# Patient Record
Sex: Female | Born: 1940
Health system: Southern US, Community
[De-identification: ages and names within clinical notes are randomized; demographics above are authoritative.]

## PROBLEM LIST (undated history)

## (undated) DIAGNOSIS — M810 Age-related osteoporosis without current pathological fracture: Secondary | ICD-10-CM

## (undated) DIAGNOSIS — I639 Cerebral infarction, unspecified: Secondary | ICD-10-CM

## (undated) DIAGNOSIS — R4701 Aphasia: Secondary | ICD-10-CM

## (undated) DIAGNOSIS — G839 Paralytic syndrome, unspecified: Secondary | ICD-10-CM

## (undated) DIAGNOSIS — G809 Cerebral palsy, unspecified: Secondary | ICD-10-CM

## (undated) DIAGNOSIS — I519 Heart disease, unspecified: Secondary | ICD-10-CM

## (undated) DIAGNOSIS — I729 Aneurysm of unspecified site: Secondary | ICD-10-CM

## (undated) HISTORY — PX: CEREBRAL ANEURYSM REPAIR: SHX164

## (undated) HISTORY — PX: OTHER SURGICAL HISTORY: SHX169

## (undated) HISTORY — DX: Cerebral palsy, unspecified: G80.9

## (undated) HISTORY — DX: Paralytic syndrome, unspecified: G83.9

## (undated) HISTORY — DX: Heart disease, unspecified: I51.9

## (undated) HISTORY — DX: Aneurysm of unspecified site: I72.9

## (undated) HISTORY — DX: Cerebral infarction, unspecified: I63.9

## (undated) HISTORY — DX: Aphasia: R47.01

---

## 1997-10-30 ENCOUNTER — Other Ambulatory Visit: Admission: RE | Admit: 1997-10-30 | Discharge: 1997-10-30 | Payer: Self-pay | Admitting: Obstetrics and Gynecology

## 1999-04-01 ENCOUNTER — Other Ambulatory Visit: Admission: RE | Admit: 1999-04-01 | Discharge: 1999-04-01 | Payer: Self-pay | Admitting: Obstetrics and Gynecology

## 2000-04-08 ENCOUNTER — Other Ambulatory Visit: Admission: RE | Admit: 2000-04-08 | Discharge: 2000-04-08 | Payer: Self-pay | Admitting: Obstetrics and Gynecology

## 2001-04-06 ENCOUNTER — Encounter: Admission: RE | Admit: 2001-04-06 | Discharge: 2001-04-06 | Payer: Self-pay | Admitting: Obstetrics and Gynecology

## 2001-04-06 ENCOUNTER — Encounter: Payer: Self-pay | Admitting: Obstetrics and Gynecology

## 2001-04-22 ENCOUNTER — Other Ambulatory Visit: Admission: RE | Admit: 2001-04-22 | Discharge: 2001-04-22 | Payer: Self-pay | Admitting: Obstetrics and Gynecology

## 2002-11-14 ENCOUNTER — Encounter: Payer: Self-pay | Admitting: Internal Medicine

## 2002-11-14 ENCOUNTER — Encounter: Admission: RE | Admit: 2002-11-14 | Discharge: 2002-11-14 | Payer: Self-pay | Admitting: Internal Medicine

## 2004-10-04 ENCOUNTER — Encounter: Admission: RE | Admit: 2004-10-04 | Discharge: 2004-10-04 | Payer: Self-pay | Admitting: Internal Medicine

## 2005-01-03 ENCOUNTER — Emergency Department (HOSPITAL_COMMUNITY): Admission: EM | Admit: 2005-01-03 | Discharge: 2005-01-03 | Payer: Self-pay | Admitting: Emergency Medicine

## 2006-11-24 ENCOUNTER — Encounter: Admission: RE | Admit: 2006-11-24 | Discharge: 2006-11-24 | Payer: Self-pay | Admitting: Internal Medicine

## 2006-12-01 ENCOUNTER — Encounter: Admission: RE | Admit: 2006-12-01 | Discharge: 2006-12-01 | Payer: Self-pay | Admitting: Internal Medicine

## 2010-08-11 ENCOUNTER — Encounter: Payer: Self-pay | Admitting: Internal Medicine

## 2014-03-21 ENCOUNTER — Ambulatory Visit (INDEPENDENT_AMBULATORY_CARE_PROVIDER_SITE_OTHER): Payer: Medicare Other

## 2014-03-21 VITALS — BP 139/76 | HR 88 | Resp 15 | Ht 62.0 in | Wt 121.2 lb

## 2014-03-21 DIAGNOSIS — G629 Polyneuropathy, unspecified: Secondary | ICD-10-CM

## 2014-03-21 DIAGNOSIS — M79609 Pain in unspecified limb: Secondary | ICD-10-CM

## 2014-03-21 DIAGNOSIS — B351 Tinea unguium: Secondary | ICD-10-CM

## 2014-03-21 DIAGNOSIS — G609 Hereditary and idiopathic neuropathy, unspecified: Secondary | ICD-10-CM

## 2014-03-21 DIAGNOSIS — M19079 Primary osteoarthritis, unspecified ankle and foot: Secondary | ICD-10-CM

## 2014-03-21 DIAGNOSIS — Q828 Other specified congenital malformations of skin: Secondary | ICD-10-CM

## 2014-03-21 DIAGNOSIS — M79676 Pain in unspecified toe(s): Secondary | ICD-10-CM

## 2014-03-21 DIAGNOSIS — G839 Paralytic syndrome, unspecified: Secondary | ICD-10-CM

## 2014-03-21 NOTE — Patient Instructions (Signed)
Corns and Calluses Corns are small areas of thickened skin that usually occur on the top, sides, or tip of a toe. They contain a cone-shaped core with a point that can press on a nerve below. This causes pain. Calluses are areas of thickened skin that usually develop on hands, fingers, palms, soles of the feet, and heels. These are areas that experience frequent friction or pressure. CAUSES  Corns are usually the result of rubbing (friction) or pressure from shoes that are too tight or do not fit properly. Calluses are caused by repeated friction and pressure on the affected areas. SYMPTOMS  A hard growth on the skin.  Pain or tenderness under the skin.  Sometimes, redness and swelling.  Increased discomfort while wearing tight-fitting shoes. DIAGNOSIS  Your caregiver can usually tell what the problem is by doing a physical exam. TREATMENT  Removing the cause of the friction or pressure is usually the only treatment needed. However, sometimes medicines can be used to help soften the hardened, thickened areas. These medicines include salicylic acid plasters and 12% ammonium lactate lotion. These medicines should only be used under the direction of your caregiver. HOME CARE INSTRUCTIONS   Try to remove pressure from the affected area.  You may wear donut-shaped corn pads to protect your skin.  You may use a pumice stone or nonmetallic nail file to gently reduce the thickness of a corn.  Wear properly fitted footwear.  If you have calluses on the hands, wear gloves during activities that cause friction.  If you have diabetes, you should regularly examine your feet. Tell your caregiver if you notice any problems with your feet. SEEK IMMEDIATE MEDICAL CARE IF:   You have increased pain, swelling, redness, or warmth in the affected area.  Your corn or callus starts to drain fluid or bleeds.  You are not getting better, even with treatment. Document Released: 04/12/2004 Document  Revised: 09/29/2011 Document Reviewed: 03/04/2011 Exit[employer] Patient Information 2015 [employer]. This information is not intended to replace advice given to you by your health care provider. Make sure you discuss any questions you have with your health care provider.  

## 2014-03-21 NOTE — Progress Notes (Signed)
   Subjective:    Patient ID: Amanda Best, female    DOB: 11-Mar-1941, 73 y.o.   MRN: [id number]  HPI Comments: N callous L right lateral midfoot D and O long-term C hard painful corn A drop-foot brace T pt's caregiver debrides occasionally     Review of Systems  All other systems reviewed and are negative.      Objective:   Physical Exam 73 year old female what well-developed somewhat frail oriented although may have some cognitive abnormality does have some history of paralysis or history of CVA. Lower extremity objective as follows pedal pulses palpable with thready DP one over 4 PT one over 4 thready nonpalpable mild varicosities noted no edema rubor noted neurologic epicritic and proprioceptive sensations intact although there muscle weakness. 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 Plan at this time the nails debrided and presence of pain symptomatology ulcer really  keratotic lesion recommended cushioned shoes patient does wear an AFO brace and will continue to do so. Reappointed in the future on an as-needed basis for followup if you have questions about a fungus nail suggested topical antifungal however patient is not able to self applied to is not likely a feasible option at this time. In the future symptoms were to persist consideration for permanent nail excision which was done on the right foot would be beneficial. Followup when needed  Alvan Dame DPM

## 2014-08-10 ENCOUNTER — Encounter: Payer: Self-pay | Admitting: Podiatrist

## 2014-08-10 ENCOUNTER — Ambulatory Visit (INDEPENDENT_AMBULATORY_CARE_PROVIDER_SITE_OTHER): Payer: Medicare Other | Admitting: Podiatrist

## 2014-08-10 DIAGNOSIS — B351 Tinea unguium: Secondary | ICD-10-CM

## 2014-08-10 DIAGNOSIS — Q828 Other specified congenital malformations of skin: Secondary | ICD-10-CM

## 2014-08-10 DIAGNOSIS — I998 Other disorder of circulatory system: Secondary | ICD-10-CM

## 2014-08-10 DIAGNOSIS — M79676 Pain in unspecified toe(s): Secondary | ICD-10-CM

## 2014-08-10 NOTE — Progress Notes (Deleted)
   Subjective:    Patient ID: Amanda Best, female    DOB: [birth year], 74 y.o.   MRN: [id number]  HPI Comments: N callous L right lateral midfoot D and O long-term C hard painful corn A drop-foot brace T pt's caregiver debrides occasionally     Review of Systems  All other systems reviewed and are negative.      Objective:   Physical Exam 74 year old female what well-developed somewhat frail oriented although may have some cognitive abnormality does have some history of paralysis or history of CVA. Lower extremity objective as follows pedal pulses palpable with thready DP one over 4 PT one over 4 thready nonpalpable mild varicosities noted no edema rubor noted neurologic epicritic and proprioceptive sensations intact although there muscle weakness. 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 Plan at this time the nails debrided and presence of pain symptomatology ulcer really  keratotic lesion recommended cushioned shoes patient does wear an AFO brace and will continue to do so. Reappointed in the future on an as-needed basis for followup if you have questions about a fungus nail suggested topical antifungal however patient is not able to self applied to is not likely a feasible option at this time. In the future symptoms were to persist consideration for permanent nail excision which was done on the right foot would be beneficial. Followup when needed  Alvan Dameichard Sikora DPM

## 2014-08-10 NOTE — Progress Notes (Signed)
Chief Complaint  Patient presents with  . Debridement    Trim toenails  . Callouses    Trim callus lateral right      HPI: Patient is 74 y.o. female who presents today for routine foot and nail care. She is nonambulatory and presents in a wheelchair. She also wears a dropfoot brace for her right foot as well. No Known Allergies  Physical Exam  Patient is awake, alert, In no acute distress.  Vascular status is decreased with palpable pedal pulses at 1/4 DP and PT bilateral and capillary refill time within normal limits. Neurological sensation is also decreased bilaterally via Semmes Weinstein monofilament at 2/5 sites. Light touch, vibratory sensation also decreased.   Dermatological exam reveals a large hyperkeratotic lesion lateral fifth metatarsal base of the right foot. Intact integument is noted post debridement. Lesion is porokeratotic in nature. Bilateral hallux nails are thickened, discolored, dystrophic in symptomatic as well as clinically mycotic bilateral.  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   Assessment: symptomatic bilateral hallux nails, callus sub met 5 base right foot, dropfoot, decreased pulses  Plan: symptomatic toenails and calluses debrided today without complication.  Recommend routine debridements every 3-4 months.  Placed a piece of moleskin on the brace at the area of the fifth met lesion-- will possibly pocket accomidate brace at next visit if no improvement in callus noted.

## 2014-11-10 ENCOUNTER — Ambulatory Visit: Payer: Medicare Other | Admitting: Podiatrist

## 2015-07-20 NOTE — Nursing Note (Signed)
Nursing Discharge Summary - Text       Nursing Discharge Summary Entered On:  07/20/2015 19:34 EST    Performed On:  07/20/2015 19:34 EST by Homero FellersFRANK, RN, AMANDA L               DC Information   Discharge To, Anticipated :   Home with family support   Professional Skilled Services, Anticipated :   No Needs   Mode of Discharge :   Wheelchair   Transportation :   Private vehicle   Accompanied By :   Daun PeacockSon   FRANK, RN, AMANDA L - 07/20/2015 19:34 EST   Education   Responsible Learner(s) :   No Data Available     Barriers To Learning :   None evident   Teaching Method :   Explanation, Printed materials   [place], RN, [place] - 07/20/2015 19:34 EST   Post-Hospital Education Adult Grid   Disease Process :   Verbalizes understanding   Importance of Follow-Up Visits :   Verbalizes understanding   When to Call Health Care Provider :   [facility]   [place], RN, [facility] - 07/20/2015 19:34 EST   Additional Learner(s) Present :   Son   Homero FellersFRANK, RN, AMANDA L - 07/20/2015 19:34 EST

## 2016-01-22 NOTE — Procedures (Signed)
Procedure Record - [facility]             Procedure Record - SFEND Summary                                                                Primary Physician:        Ricke Hey    Case Number:              [id number]    Finalized Date/Time:      02/28/16 11:29:01    Pt. Name:                 Linda Murray, Linda Murray    D.O.B./Sex:               1941/07/19    Female    Med Rec #:                [id number]    Physician:                Ricke Hey    Financial #:              [id number]    Pt. Type:                 O    Room/Bed:                 /    Admit/Disch:              01/22/16 18:22:00 -                              01/22/16 22:22:00    Institution:       [facility] - Case Times                                                                                                        Entry 1  Patient      In Room Time             01/22/16 20:45:00               Out Room Time                   01/22/16 21:28:00    Anesthesia     Procedure      Start Time               01/22/16 21:00:00               Stop Time                       01/22/16 21:06:00    Last Modified By:         Corrie Mckusick RN, DONNA J                              01/22/16 21:27:24      SFEND - Case Times Audit                                                                         01/22/16 21:27:24         Owner: Donna Christen                               Modifier: BENODO                                                        <+> 1         Out Room Time     01/22/16 21:06:45         Owner: Donna Christen                               Modifier: BENODO                                                        <+> 1         Stop Time        SFEND - Case Attendance                                                                                                   Entry  1                         Entry 2                          Entry 3                                          Case Attendee             SHORES-MD,  Kearney Hard, RN, [place],  [place]    Role Performed            Surgeon Primary                 Circulator                      Endoscopy Technician    Time In                   01/22/16 20:45:00               01/22/16 20:45:00               01/22/16 20:45:00    Time Out                  01/22/16 21:28:00               01/22/16 21:28:00               01/22/16 21:28:00    Procedure                 Esophagastroduodenoscopy        Esophagastroduodenoscopy        Esophagastroduodenoscopy                               with Removal Fo                 with Removal Fo                 with Removal Fo    Last Modified By:         Corrie Mckusick RN, DONNA Cleatrice Burke, RN, DONNA Cleatrice Burke, RN, DONNA J                              01/22/16 21:27:26               01/22/16 21:27:26               01/22/16 21:27:26                                Entry 4  Case Attendee             Oswaldo Conroy    Role Performed            Anesthesiologist    Time In                   01/22/16 20:45:00    Time Out                  01/22/16 21:28:00    Procedure                 Esophagastroduodenoscopy                               with Removal Fo    Last Modified By:         Corrie Mckusick RN, DONNA J                              01/22/16 21:27:26      SFEND - Case Attendance Audit                                                                    01/22/16 21:27:26         Owner: Donna Christen                               Modifier: BENODO                                                            1     <+> Time Out            1     <*> Procedure                              Esophagastroduodenoscopy with Removal Fo            2     <+>  Time Out            2     <*> Procedure                              Esophagastroduodenoscopy with Removal Fo            3     <+> Time Out            3     <*> Procedure                              Esophagastroduodenoscopy with Removal Fo  4     <+> Time Out            4     <*> Procedure                              Esophagastroduodenoscopy with Removal Fo     01/22/16 21:07:28         Owner: Donna Christen                               Modifier: BENODO                                                        <+> 1         Procedure        <+> 2         Procedure        <+> 3         Procedure        <+> 4         Procedure     01/22/16 21:07:20         Owner: Donna Christen                               Modifier: BENODO                                                            1     <-> Procedure                              Esophagastroduodenoscopy            2     <-> Procedure                              Esophagastroduodenoscopy            3     <-> Procedure                              Esophagastroduodenoscopy            4     <-> Procedure                              Esophagastroduodenoscopy     01/22/16 21:06:49         Owner: Donna Christen                               Modifier: Donna Christen  1     <*> Procedure                              Esophagastroduodenoscopy            2     <+> Time In            2     <*> Procedure                              Esophagastroduodenoscopy            3     <+> Time In            3     <*> Procedure                              Esophagastroduodenoscopy            4     <+> Time In            4     <*> Procedure                              Esophagastroduodenoscopy     01/22/16 21:06:40         Owner: Donna Christen                               Modifier: BENODO                                                        <+> 2         Case Attendee        <+> 2         Role Performed        <+> 2         Procedure        <+> 3         Case  Attendee        <+> 3         Role Performed        <+> 3         Procedure        <+> 4         Case Attendee        <+> 4         Role Performed        <+> 4         Procedure     01/22/16 21:05:14         Owner: [email or web address]                               Modifier: BENODO  1     <+> Time In            1     <*> Procedure                              Esophagastroduodenoscopy        SFEND - Patient Positioning                                                                     Pre-Care Text:            A.240 Assesses baseline skin condition A.280 Identifies baseline musculoskeletal status A.280.1 Identifies           physical alterations that require additional precautions for procedure-specific positioning A.510.8 Maintains           patient's dignity and privacy Im.120 Implements protective measures to prevent skin/tissue injury due to           mechanical sources Im.40 Positions the patient Im.80 Applies safety devices                              Entry 1                                                                                                          Procedure                 Esophagastroduodenoscopy        Body Position                   Lateral Left Up                               with Removal Fo    Left Arm Position         Extended in Arm Cradle          Right Arm Position              Extended in Arm Cradle    Left Leg Position         Padding Under Knee              Right Leg Position              Padding Under Knee    Feet Uncrossed            Yes                             Pressure Points  Yes                                                              Checked     Positioning Device        Foam Wedge                      Positioned By                   Corrie Mckusick, RN, DONNA J,                                                                                              BURNETTE-MD,  JEFFREY                                                                                               DEAN, Sinkler,  [place]    Outcome Met (O.80)        Yes    Last Modified By:         Corrie Mckusick, RN, DONNA J                              01/22/16 21:09:28    Post-Care Text:            E.10 Evaluates for signs and symptoms of physical injury to skin and tissue E.290 Evaluates musculoskeletal           status O.80 Patient is free from signs and symptoms of injury related to positioning O.120 the patient is free           from signs and symptoms of injury related to transfer/transport  O.250 Patient's musculoskeletal status is           maintained at or improved from baseline levels      SFEND - Skin Assessment                                                                         Pre-Care Text:            A.240 Assesses baseline skin condition Im.120 Implements protective measures to prevent skin or tissue injury  due to mechanical sources  Im.280.1 Implements progective measures to prevent skin or tissue injury due to           thermal sources Im.360 Monitors for signs and symptons of infection                              Entry 1                                                                                                          Skin Integrity            Intact    Last Modified By:         Corrie Mckusick, RN, DONNA J                              01/22/16 21:09:33    Post-Care Text:            E.10 Evaluates for signs and symptoms of physical injury to skin and tissue E.270 Evaluate tissue perfusion           O.60 Patient is free from signs and symptoms of injury caused by extraneous objects   O.210 Patinet's tissue           perfusion is consistent with or improved from baseline levels      SFEND - Procedure Time Out                                                                      Pre-Care Text:            A.10 Confirms patient identity A.20 Verifies operative procedure, surgical site, and laterality A.20.1 Verifies           consent for planned procedure  A.30 Verifies allergies                              Entry 1                                                                                                          Surgical/Procedure        Yes  Time Out Complete               01/22/16 20:59:00    Team confirms     correct patient,     correct site and     correct procedure     Last Modified By:         Corrie Mckusick RN, DONNA J                              01/22/16 21:05:26    Post-Care Text:            E.30 Evaluates verification process for correct patient, site, side, and level surgery      SFEND - General Case Data                                                                                                 Entry 1                                                                                                          Case Information      ASA Class                3E                              Case Level                      Level 2     OR                       SF Endo 04                      Specialty                       Gastroenterology (SN)     Wound Class              No Incision    Preop Diagnosis           Food impaction    Last Modified By:         Samella Parr B                              02/28/16 11:28:57      [place] - General Case Data Audit  02/28/16 11:28:57         Owner: [email or web address]                               Modifier: BISHOFFT                                                          1     <*> OR                                     SF Endo Add On     01/23/16 11:19:08         Owner: Donna Christen                               Modifier: [email or web address]                                                            1     <*> Case Level                             Level 1     01/22/16 21:07:03         Owner: Donna Christen                               Modifier: BENODO                                                        <+> 1         ASA Class        <+> 1         Preop  Diagnosis        SFEND - Procedures                                                                                                        Entry 1  Procedure     Description      Procedure                Esophagastroduodenoscopy        Surgical Procedure              Food Impaction                               with Removal Foreign           Text                               Body    Primary Procedure         Yes                             Primary Surgeon                 Ander Slade                                                                                              [facility]    Start                     01/22/16 21:00:00               Stop                            01/22/16 21:06:00    Anesthesia Type           Monitored Anesthesia            Surgical Service                Gastroenterology (SN)                              Care    Wound Class               No Incision    Last Modified By:         Corrie Mckusick, RN, DONNA J                              01/22/16 21:07:24      SFEND - Foreign Body Retrieval  Entry 1                                                                                                          Endoscopic                Esophagus                       Foreign Object for              food    Anatomical Location                                       Retrieval     Last Modified By:         Corrie Mckusick, RN, DONNA J                              01/22/16 21:07:54      SFEND - Transfer                                                                                                          Entry 1                                                                                                          Transferred By            Corrie Mckusick, RN, DONNA J,            Via                             Stretcher                               BURNETTE-MD,  JEFFREY                              Marlou Sa  Post-op Destination       PACU    Skin Assessment      Condition                Intact    Last Modified By:         Corrie Mckusick RN, DONNA J                              01/22/16 21:09:47      Case Comments                                                                                         <None>              Finalized By: Jaclynn Major      Document Signatures                                                                             Signed By:           Corrie Mckusick RN, DONNA J 01/22/16 21:27          CXKGY,  JENNIFER 01/23/16 11:19          CURRY,  TRACI B 02/28/16 11:29      Unfinalized History                                                                                     Date/Time            Username    Reason for Unfinalizing         Freetext Reason for Unfinalizing                                          01/23/16 11:19       Lorraine      Charging          02/28/16 11:28       BISHOFFT    Quality Audits

## 2016-01-22 NOTE — Nursing Note (Signed)
Nursing Discharge Summary - Text       Nursing Discharge Summary Entered On:  01/22/2016 22:21 EDT    Performed On:  01/22/2016 22:21 EDT by CABE, RN, ADAM Z               DC Information   Discharge To, Anticipated :   Home with family support   Mode of Discharge :   Wheelchair   Transportation :   Private vehicle   Accompanied By :   Liliana ClineSon   CABE, RN, ADAM Z - 01/22/2016 22:21 EDT   Education   Responsible Learner(s) :   No Data Available     Home Caregiver Present for Session :   Yes   Barriers To Learning :   None evident   Teaching Method :   Demonstration, Explanation, Printed materials   CABE, RN, ADAM Z - 01/22/2016 22:21 EDT   Post-Hospital Education Adult Grid   Activity Expectations :   Verbalizes understanding   Diagnostic Results :   Verbalizes understanding   Disease Process :   Verbalizes understanding   Equipment/Devices :   TEFL teacherVerbalizes understanding   Importance of Follow-Up Visits :   Verbalizes understanding   Pain Management :   Verbalizes understanding   Plan of Care :   Verbalizes understanding   When to Call Health Care Provider :   Verbalizes understanding   CABE, RN, ADAM Z - 01/22/2016 22:21 EDT   Additional Learner(s) Present :   Liliana ClineSon   CABE, RN, ADAM Z - 01/22/2016 22:21 EDT

## 2016-01-22 NOTE — Nursing Note (Signed)
Medication Administration Follow Up-Text       Medication Administration Follow Up Entered On:  01/22/2016 19:42 EDT    Performed On:  01/22/2016 19:42 EDT by Loura PardonABE, RN, ADAM Z      Intervention Information:     glucagon  Performed by Loura PardonABE, RN, ADAM Z on 01/22/2016 19:12:00 EDT       glucagon,1mg   IM,Deltoid, Left       Medication Effectiveness Evaluation   Medication Administration Reason :   Other: Pt. choking   Medication Effective :   Yes   Medication Response :   Symptoms improved, Continue to observe for symptoms, Provider notified   CABE, RN, ADAM Z - 01/22/2016 19:42 EDT

## 2016-01-22 NOTE — H&P (Signed)
EGDForeign bodyH&P        Patient:   Linda Murray, Linda Murray             MRN: [id number]            FIN: [id number]               Age:   75 years     Sex:  Female     DOB:  1941-04-26   Associated Diagnoses:   None   Author:   Ricke Hey      Preoperative Information   Dysphagia, Food impaction      Chief Complaint   Dysphagia   Presents from home. Has not been able handle liquids or saliva since eating pork chops sandwich. No difficulty breathing. Has been having dysphagia intermittently over the last 6 months.      Review of Systems   Constitutional:  No fever, No chills, No sweats.    Eye:  deferred.    Ear/Nose/Mouth/Throat:  deferred.    Respiratory:  No shortness of breath, No cough.    Cardiovascular:  No chest pain, No palpitations.    Gastrointestinal:  Dysphagia.    Genitourinary:  deferred.    Hematology/Lymphatics:  deferred.    Endocrine:  deferred.    Immunologic:  deferred.    Musculoskeletal:  deferred.    Integumentary:  deferred.    Neurologic:  deferred.    Psychiatric:  deferred.    deferred      Health Status   Allergies:    Allergic Reactions (Selected)  No Known Medication Allergies,    Allergies (1) Active Reaction  No Known Medication Allergies None Documented     Current medications: None,  (Selected)   Inpatient Medications  Future  Zero Time Placeholder: 1 mL, Day of Tx, Day 1  Zero Time Placeholder: 1 mL, Day of Tx, Day 1  denosumab: 60 mg, Subcutaneous, Day of Tx, Day 1  denosumab: 60 mg, Subcutaneous, Day of Tx, Day 1  Documented Medications  Documented  Caltrate: mg, Oral, Daily, 0 Refill(s)  Dilantin 100 mg oral capsule, extended release: 300 mg, 3 cap, Oral, Once a day (at bedtime), 270 cap, 0 Refill(s)  Dilantin 100 mg oral capsule, extended release: TK ONE C PO  QAM AND TK 2 CS PO QHS  Prolia 60 mg/mL subcutaneous solution: 60 mg, 1 mL, Subcutaneous, q45mo 1 mL, 0 Refill(s)  VESIcare 10 mg oral tablet: TK 1 T PO D,    Home Medications (5) Active  Caltrate , Oral, Daily  Dilantin  100 mg oral capsule, extended release   Dilantin 100 mg oral capsule, extended release 300 mg = 3 cap, Oral, Once a day (at bedtime)  Prolia 60 mg/mL subcutaneous solution 60 mg = 1 mL, Subcutaneous, q642moVESIcare 10 mg oral tablet   ,    No qualifying data available     Problem list:    No problem items selected or recorded.,    Active Problems (3)  Aphasia   Apraxia   CVA (cerebral vascular accident)         Histories   Past Medical History:    No active or resolved past medical history items have been selected or recorded.   Family History:    No family history items have been selected or recorded.   Procedure history:    No active procedure history items have been selected or recorded.   Social History  Social & Psychosocial Habits    Tobacco  01/22/2016  Use: Former smoker    Type: Cigarettes  .        Physical Examination   Vital Signs   01/22/2016 20:06 EDT Heart Rate Monitored 87 bpm    Respiratory Rate 16 br/min    Systolic Blood Pressure 350 mmHg  HI    Diastolic Blood Pressure 73 mmHg    SpO2 98 %   01/22/2016 18:24 EDT Temperature Oral 36.4 degC    Heart Rate Monitored 83 bpm    Respiratory Rate 17 br/min    Systolic Blood Pressure 95 mmHg    Diastolic Blood Pressure 89 mmHg    SpO2 96 %         Vital Signs (last 24 hrs)_____  Last Charted___________  Temp Oral     36.4 degC  (JUL 04 18:24)  Resp Rate         16 br/min  (JUL 04 20:06)  SBP      H 176mHg  (JUL 04 20:06)  DBP      73 mmHg  (JUL 04 20:06)  SpO2      98 %  (JUL 04 20:06)  Height      165.1 cm  (JUL 04 18:24)     Measurements from flowsheet : Measurements   01/22/2016 18:24 EDT Height/Length Measured 165.1 cm    Weight Dosing 51 kg      General:  Alert and oriented, No acute distress.    Respiratory:  Lungs are clear to auscultation.    Cardiovascular:  Normal rate, Regular rhythm.    Gastrointestinal:  Soft, Non-tender, Non-distended, Normal bowel sounds.       Health Maintenance      Health Maintenance     Pending (in the next year)      There are no current recommendations pending     Satisfied (in the past 1 year)     There are no satisfied recommendations within the defined date range          Review / Management   Results review:     Labs (Last four charted values)  WBC                  8.4 (JUL 04)   Hgb                  14.0 (JUL 04)   Hct                  39.5 (JUL 04)   Plt                  220 (JUL 04)   Na                   144 (JUL 04)   K                    4.2 (JUL 04)   CO2                  26 (JUL 04)   Cl                   104 (JUL 04)   Cr                   0.8 (JUL 04)   BUN  18 (JUL 04)   Glucose Random       H 106 (JUL 04) , Lab results   01/22/2016 19:43 EDT Estimated Creatinine Clearance 48.92 mL/min   01/22/2016 19:12 EDT WBC 8.4 x10e3/mcL    RBC 3.98 x10e6/mcL    Hgb 14.0 g/dL    Hct 39.5 %    MCV 99.1 fL  HI    MCH 35.3 pg  HI    MCHC 35.6 g/dL    RDW 13.4 %    Platelet 220 x10e3/mcL    MPV 7.8 fL    Sodium Lvl 144 mmol/L    Potassium Lvl 4.2 mmol/L    Chloride 104 mmol/L    CO2 26 mmol/L    Glucose Random 106 mg/dL  HI    BUN 18 mg/dL    Creatinine Lvl 0.8 mg/dL    BUN/Creat Ratio 21.7  HI    AGAP 14 mmol/L    Osmolality Calc 289 mOsm/kg    Calcium Lvl 9.2 mg/dL    eGFR AA >60  NA    eGFR Non-AA >60  NA   .    egd +/- dilation      Impression and Plan   Diagnosis     dysphagia.     Foreign body in esophagus.     Condition:  Stable.    Diagnosis   Course:  Progressing as expected.    Counseled:  Patient.       Professional Services   EGD+ foreign body removal   Librarian, academic Signed on 01/22/2016 08:28 PM EDT   ________________________________________________   Ricke Hey

## 2016-01-24 NOTE — Oncology Nurse Navigation (Signed)
Oncology Patient Summary                    [facility]  [address]  [place], [place] [id number]  [phone]        Person Information:  Name: Dwain SarnaSTURM, Ikesha  Birthdate: 1941/01/17  Home Address: [address] [place] [id number]        Any confirmed future appointments are below:               Type [facility]   INF Injection 60 Minutes SF Inf Ther 01/24/2016 10:30:00 01/24/2016 11:30:00 Confirmed          Nursing Comments:

## 2016-01-24 NOTE — Care Plan (Signed)
Ambulatory Patient Education     Patient Education Materials Follows:

## 2016-10-08 NOTE — Care Plan (Signed)
Ambulatory Patient Education     Patient Education Materials Follows:

## 2016-10-08 NOTE — Oncology Nurse Navigation (Signed)
Oncology Patient Summary                    [facility]  [address]  [place], [place] [address]  [phone]        Person Information:  Name: Linda Murray, Linda Murray  Birthdate: 03-18-1941  Home Address: [address] [place] [address]      Any confirmed future appointments are below:                Type Location Start [place] [place]   INF Injection 60 Minutes SF Inf Ther 10/08/2016 09:30:00 10/08/2016 10:30:00 Confirmed         Nursing Comments     ----------------------------------------------------------------------------------------------------------------------------  [facility] allows you to manage your health, view your test results, and retrieve your discharge documents from your hospital stay securely and conveniently from your computer.  To begin the enrollment process, visit [email or web address] Click on "Sign up now" under [facility].

## 2017-03-26 NOTE — Oncology Nurse Navigation (Signed)
Oncology Patient Summary                    [facility]  [address]  [place], [place] [address]  [phone]        Person Information:  Name: Linda Murray, Linda Murray  Birthdate: [birth year]  Home Address: [address] [place] [address]      Any confirmed future appointments are below:         Nursing Comments     ----------------------------------------------------------------------------------------------------------------------------  [facility] allows you to manage your health, view your test results, and retrieve your discharge documents from your hospital stay securely and conveniently from your computer.  To begin the enrollment process, visit [email or web address] Click on "Sign up now" under [facility].

## 2017-03-26 NOTE — Care Plan (Signed)
Ambulatory Patient Education     Patient Education Materials Follows:

## 2017-03-30 NOTE — Care Plan (Signed)
Ambulatory Patient Education     Patient Education Materials Follows:

## 2017-03-30 NOTE — Oncology Nurse Navigation (Signed)
Oncology Patient Summary                    [facility]  [address] [place] [address]  [phone]        Person Information:  Name: Colonna, Shontell  Birthdate: 02/20/1941  Home Address: [address] [place] [address]      Any confirmed future appointments are below:         Nursing Comments     ----------------------------------------------------------------------------------------------------------------------------  [facility] allows you to manage your health, view your test results, and retrieve your discharge documents from your hospital stay securely and conveniently from your computer.  To begin the enrollment process, visit [email or web address]. Click on ???Sign up now??? under [facility].

## 2017-10-22 NOTE — Care Plan (Signed)
Ambulatory Patient Education     Patient Education Materials Follows:

## 2017-10-22 NOTE — Oncology Nurse Navigation (Signed)
Oncology Patient Summary                     [facility]  [address]  [place], [place] [address]  [phone]        Person Information:  Name: Linda Murray, Linda Murray  Birthdate: [birth year]  Home Address: [address] [place] [address]      Any confirmed future appointments are below:                 Type Location Start [facility]   INF Injection 60 Minutes SF Inf Ther 10/22/2017 11:00:00 10/22/2017 12:00:00 Confirmed   BD Bone Densitometry SF Bone Density 12/09/2017 10:40:00 12/09/2017 11:00:00 Confirmed         Nursing Comments     ----------------------------------------------------------------------------------------------------------------------------  [facility] allows you to manage your health, view your test results, and retrieve your discharge documents from your hospital stay securely and conveniently from your computer.  To begin the enrollment process, visit [email or web address] Click on "Sign up now" under [facility].

## 2018-09-16 LAB — COMPREHENSIVE METABOLIC PANEL
ALT: 12 U/L (ref 0–33)
AST: 16 U/L (ref 0–32)
Albumin/Globulin Ratio: 1.8 mmol/L (ref 1.00–2.00)
Albumin: 4.4 g/dL (ref 3.5–5.2)
Alk Phosphatase: 147 U/L — ABNORMAL HIGH (ref 35–117)
Anion Gap: 13 mmol/L (ref 2–17)
BUN: 22 mg/dL (ref 8–23)
CO2: 25 mmol/L (ref 22–29)
Calcium: 9.7 mg/dL (ref 8.8–10.2)
Chloride: 106 mmol/L (ref 98–107)
Creatinine: 1.2 mg/dL — ABNORMAL HIGH (ref 0.5–0.9)
GFR African American: 50 mL/min/{1.73_m2} — ABNORMAL LOW (ref >=90)
GFR Non-African American: 44 mL/min/{1.73_m2} — ABNORMAL LOW (ref >=90)
Globulin: 3 g/dL (ref 1.9–4.4)
Glucose: 74 mg/dL (ref 70–99)
OSMOLALITY CALCULATED: 289 mOsm/kg — ABNORMAL HIGH (ref 270–287)
Potassium: 4.6 mmol/L (ref 3.5–5.3)
Sodium: 144 mmol/L (ref 135–145)
Total Bilirubin: 0.3 mg/dL (ref 0.00–1.20)
Total Protein: 6.9 g/dL (ref 6.4–8.3)

## 2018-09-16 LAB — CBC WITH AUTO DIFFERENTIAL
Absolute Baso #: 0 10*3/uL (ref 0.0–0.2)
Absolute Eos #: 0.1 10*3/uL (ref 0.0–0.5)
Absolute Lymph #: 1.6 10*3/uL (ref 1.0–3.2)
Absolute Mono #: 0.4 10*3/uL (ref 0.3–1.0)
Basophils %: 0.7 % (ref 0.0–2.0)
Eosinophils %: 1.7 % (ref 0.0–7.0)
Hematocrit: 39.9 % (ref 38.0–47.0)
Hemoglobin: 13.5 g/dL (ref 11.5–15.7)
Immature Grans (Abs): 0 10*3/uL
Immature Granulocytes: 0.3 %
Lymphocytes: 27 % (ref 15.0–45.0)
MCH: 34 pg (ref 27.0–34.5)
MCHC: 33.8 g/dL (ref 32.0–36.0)
MCV: 100.5 fL — ABNORMAL HIGH (ref 81.0–99.0)
MPV: 9.6 fL (ref 7.2–13.2)
Monocytes: 7.1 % (ref 4.0–12.0)
NRBC Absolute: 0 10*3/uL
NRBC Automated: 0 %
Neutrophils %: 63.2 % (ref 42.0–74.0)
Neutrophils Absolute: 3.7 10*3/uL (ref 1.6–7.3)
Platelets: 276 10*3/uL (ref 140–440)
RBC: 3.97 x10e6/mcL (ref 3.60–5.20)
RDW: 12.6 % (ref 11.0–16.0)
WBC: 5.9 10*3/uL (ref 3.8–10.6)

## 2018-09-17 LAB — FOLATE: Folate: >20 ng/mL (ref 4.80–24.20)

## 2018-09-17 LAB — DILANTIN: Total Phenytoin: 23.8 ug/mL (ref 10.0–20.0)

## 2018-09-28 LAB — DILANTIN: Total Phenytoin: 6.4 ug/mL — ABNORMAL LOW (ref 10.0–20.0)

## 2018-10-04 LAB — CULTURE, URINE: FINAL REPORT: >100000

## 2019-05-25 LAB — CBC
Hematocrit: 41.9 % (ref 34.0–47.0)
Hemoglobin: 13.9 g/dL (ref 11.5–15.7)
MCH: 33.7 pg (ref 27.0–34.5)
MCHC: 33.2 g/dL (ref 32.0–36.0)
MCV: 101.5 fL — ABNORMAL HIGH (ref 81.0–99.0)
MPV: 10.2 fL (ref 7.2–13.2)
NRBC Absolute: 0 10*3/uL (ref 0.000–0.012)
NRBC Automated: 0 % (ref 0.0–0.2)
Platelets: 240 10*3/uL (ref 140–440)
RBC: 4.13 x10e6/mcL (ref 3.60–5.20)
RDW: 12.8 % (ref 11.0–16.0)
WBC: 5.3 10*3/uL (ref 3.8–10.6)

## 2019-05-25 LAB — COMPREHENSIVE METABOLIC PANEL
ALT: 12 U/L (ref 0–33)
AST: 15 U/L (ref 0–32)
Albumin/Globulin Ratio: 2 mmol/L (ref 1.00–2.00)
Albumin: 4.6 g/dL (ref 3.5–5.2)
Alk Phosphatase: 126 U/L — ABNORMAL HIGH (ref 35–117)
Anion Gap: 9 mmol/L (ref 2–17)
BUN: 23 mg/dL (ref 8–23)
CO2: 26 mmol/L (ref 22–29)
Calcium: 9.3 mg/dL (ref 8.8–10.2)
Chloride: 106 mmol/L (ref 98–107)
Creatinine: 0.7 mg/dL (ref 0.5–0.9)
GFR African American: 96 mL/min/{1.73_m2} (ref >=90)
GFR Non-African American: 83 mL/min/{1.73_m2} — ABNORMAL LOW (ref >=90)
Globulin: 2 g/dL (ref 1.9–4.4)
Glucose: 94 mg/dL (ref 70–99)
OSMOLALITY CALCULATED: 285 mOsm/kg (ref 270–287)
Potassium: 4.8 mmol/L (ref 3.5–5.3)
Sodium: 141 mmol/L (ref 135–145)
Total Bilirubin: 0.3 mg/dL (ref 0.00–1.20)
Total Protein: 6.9 g/dL (ref 6.4–8.3)

## 2019-05-26 LAB — DILANTIN: Total Phenytoin: 20.3 ug/mL (ref 10.0–20.0)

## 2019-08-09 LAB — COMPREHENSIVE METABOLIC PANEL
ALT: 51 U/L — ABNORMAL HIGH (ref 0–33)
AST: 107 U/L — ABNORMAL HIGH (ref 0–32)
Albumin/Globulin Ratio: 1.4 mmol/L (ref 1.00–2.00)
Albumin: 4.5 g/dL (ref 3.5–5.2)
Alk Phosphatase: 86 U/L (ref 35–117)
Anion Gap: 13 mmol/L (ref 2–17)
BUN: 41 mg/dL — ABNORMAL HIGH (ref 8–23)
CO2: 23 mmol/L (ref 22–29)
Calcium: 7.8 mg/dL — ABNORMAL LOW (ref 8.8–10.2)
Chloride: 115 mmol/L — ABNORMAL HIGH (ref 98–107)
Creatinine: 1 mg/dL (ref 0.5–1.0)
GFR African American: 62 mL/min/{1.73_m2} — ABNORMAL LOW (ref >=90)
GFR Non-African American: 54 mL/min/{1.73_m2} — ABNORMAL LOW (ref >=90)
Globulin: 3 g/dL (ref 1.9–4.4)
Glucose: 100 mg/dL — ABNORMAL HIGH (ref 70–99)
OSMOLALITY CALCULATED: 310 mOsm/kg — ABNORMAL HIGH (ref 270–287)
Potassium: 4.4 mmol/L (ref 3.5–5.3)
Sodium: 151 mmol/L — ABNORMAL HIGH (ref 135–145)
Total Bilirubin: 0.6 mg/dL (ref 0.00–1.20)
Total Protein: 7.7 g/dL (ref 6.4–8.3)

## 2019-08-09 LAB — CBC
Hematocrit: 45.6 % (ref 34.0–47.0)
Hemoglobin: 15.6 g/dL (ref 11.5–15.7)
MCH: 34.4 pg (ref 27.0–34.5)
MCHC: 34.2 g/dL (ref 32.0–36.0)
MCV: 100.7 fL — ABNORMAL HIGH (ref 81.0–99.0)
MPV: 10.9 fL (ref 7.2–13.2)
NRBC Absolute: 0 10*3/uL (ref 0.000–0.012)
NRBC Automated: 0 % (ref 0.0–0.2)
Platelets: 142 10*3/uL (ref 140–440)
RBC: 4.53 x10e6/mcL (ref 3.60–5.20)
RDW: 13.3 % (ref 11.0–16.0)
WBC: 5.9 10*3/uL (ref 3.8–10.6)

## 2019-08-10 LAB — DILANTIN: Total Phenytoin: 6.5 ug/mL — ABNORMAL LOW (ref 10.0–20.0)

## 2019-08-10 LAB — COVID-19: SARS-CoV-2: DETECTED — AB

## 2019-08-11 LAB — BASIC METABOLIC PANEL
Anion Gap: 16 mmol/L (ref 2–17)
Anion Gap: 16 mmol/L (ref 2–17)
BUN: 41 mg/dL — ABNORMAL HIGH (ref 8–23)
BUN: 32 mg/dL — ABNORMAL HIGH (ref 8–23)
CO2: 19 mmol/L — ABNORMAL LOW (ref 22–29)
CO2: 17 mmol/L — ABNORMAL LOW (ref 22–29)
Calcium: 8.9 mg/dL (ref 8.8–10.2)
Calcium: 7.8 mg/dL — ABNORMAL LOW (ref 8.8–10.2)
Chloride: 117 mmol/L — ABNORMAL HIGH (ref 98–107)
Chloride: 116 mmol/L — ABNORMAL HIGH (ref 98–107)
Creatinine: 1.6 mg/dL — ABNORMAL HIGH (ref 0.5–1.0)
Creatinine: 1.3 mg/dL — ABNORMAL HIGH (ref 0.5–1.0)
GFR African American: 35 mL/min/{1.73_m2} — ABNORMAL LOW (ref >=90)
GFR African American: 46 mL/min/{1.73_m2} — ABNORMAL LOW (ref >=90)
GFR Non-African American: 31 mL/min/{1.73_m2} — ABNORMAL LOW (ref >=90)
GFR Non-African American: 39 mL/min/{1.73_m2} — ABNORMAL LOW (ref >=90)
Glucose: 125 mg/dL — ABNORMAL HIGH (ref 70–99)
Glucose: 164 mg/dL — ABNORMAL HIGH (ref 70–99)
OSMOLALITY CALCULATED: 313 mOsm/kg — ABNORMAL HIGH (ref 270–287)
OSMOLALITY CALCULATED: 306 mOsm/kg — ABNORMAL HIGH (ref 270–287)
Potassium: 4 mmol/L (ref 3.5–5.3)
Potassium: 4.5 mmol/L (ref 3.5–5.3)
Sodium: 152 mmol/L — ABNORMAL HIGH (ref 135–145)
Sodium: 149 mmol/L — ABNORMAL HIGH (ref 135–145)

## 2019-08-11 LAB — CBC WITH AUTO DIFFERENTIAL
Absolute Baso #: 0 10*3/uL (ref 0.0–0.2)
Absolute Eos #: 0 10*3/uL (ref 0.0–0.5)
Absolute Lymph #: 1.1 10*3/uL (ref 1.0–3.2)
Absolute Mono #: 0.5 10*3/uL (ref 0.3–1.0)
Basophils %: 0.1 % (ref 0.0–2.0)
Eosinophils %: 0 % (ref 0.0–7.0)
Hematocrit: 49.2 % — ABNORMAL HIGH (ref 34.0–47.0)
Hemoglobin: 16 g/dL — ABNORMAL HIGH (ref 11.5–15.7)
Immature Grans (Abs): 0.05 10*3/uL (ref 0.00–0.06)
Immature Granulocytes: 0.6 % (ref 0.1–0.6)
Lymphocytes: 13 % — ABNORMAL LOW (ref 15.0–45.0)
MCH: 33.3 pg (ref 27.0–34.5)
MCHC: 32.5 g/dL (ref 32.0–36.0)
MCV: 102.5 fL — ABNORMAL HIGH (ref 81.0–99.0)
MPV: 11.9 fL (ref 7.2–13.2)
Monocytes: 5.3 % (ref 4.0–12.0)
NRBC Absolute: 0 10*3/uL (ref 0.000–0.012)
NRBC Automated: 0 % (ref 0.0–0.2)
Neutrophils %: 81 % — ABNORMAL HIGH (ref 42.0–74.0)
Neutrophils Absolute: 6.9 10*3/uL (ref 1.6–7.3)
Platelets: 185 10*3/uL (ref 140–440)
RBC: 4.8 x10e6/mcL (ref 3.60–5.20)
RDW: 13.7 % (ref 11.0–16.0)
WBC: 8.5 10*3/uL (ref 3.8–10.6)

## 2019-08-11 LAB — ADD ON LAB TEST

## 2019-08-11 LAB — CULTURE, URINE: FINAL REPORT: >100000

## 2019-08-11 LAB — D-DIMER, QUANTITATIVE: D-Dimer, Quant: 2.79 mcg/mL FEU — ABNORMAL HIGH (ref 0.19–0.51)

## 2019-08-11 LAB — LACTIC ACID: Lactic Acid: 1.5 mmol/L (ref 0.5–2.0)

## 2019-08-11 LAB — C-REACTIVE PROTEIN: CRP: 9.57 mg/dL — ABNORMAL HIGH (ref 0.00–0.50)

## 2019-08-11 NOTE — ED Provider Notes (Signed)
Medication administration        Patient:   Linda Murray, Linda Murray             MRN: [id number]            FIN: [id number]               Age:   79 years     Sex:  Female     DOB:  12-30-1940   Associated Diagnoses:   Dehydration   Author:   Caleb Popp      Basic Information   Addendum: Time of addendum:: 08/11/2019 07:46:00 , Pertinent history: Pt. is awaiting Bamlan infusion.      Medical Decision Making   Results review:  Lab results : Lab View   08/11/2019 9:18 EST Lactic Acid Lvl 1.5 mmol/L   08/11/2019 8:29 EST Estimated Creatinine Clearance 20.59 mL/min   08/11/2019 8:02 EST WBC 8.5 x10e3/mcL    RBC 4.80 x10e6/mcL    Hgb 16.0 g/dL  HI    HCT 49.2 %  HI    MCV 102.5 fL  HI    MCH 33.3 pg    MCHC 32.5 g/dL    RDW 13.7 %    Platelet 185 x10e3/mcL    MPV 11.9 fL    Neutro Auto 81.0 %  HI    Neutro Absolute 6.9 x10e3/mcL    Immature Grans Percent 0.6 %    Immature Grans Absolute 0.05 x10e3/mcL    Lymph Auto 13.0 %  LOW    Lymph Absolute 1.1 x10e3/mcL    Mono Auto 5.3 %    Mono Absolute 0.5 x10e3/mcL    Eosinophil Percent 0.0 %    Eos Absolute 0.0 x10e3/mcL    Basophil Auto 0.1 %    Baso Absolute 0.0 x10e3/mcL    NRBC Absolute Auto 0.000 x10e3/mcL    NRBC Percent Auto 0.0 %    D Dimer 2.79 mcg/mL FEU  HI    Sodium Lvl 152 mmol/L  HI    Potassium Lvl 4.0 mmol/L    Chloride 117 mmol/L  HI    CO2 19 mmol/L  LOW    Glucose Random 125 mg/dL  HI    BUN 41 mg/dL  HI    Creatinine Lvl 1.6 mg/dL  HI    AGAP 16 mmol/L    Osmolality Calc 313 mOsm/kg  HI    Calcium Lvl 8.9 mg/dL    eGFR AA 35 mL/min/1.76m???  LOW    eGFR Non-AA 31 mL/min/1.762m??  LOW   08/11/2019 7:31 EST Estimated Creatinine Clearance 32.94 mL/min   .   Radiology results:  Rad Results (ST)   CT Angiography Chest w/ + w/o Contrast  ?  08/11/19 10:58:27  CTA chest: 08/11/19    INDICATION:PE suspected, high pretest prob.    COMPARISON: Chest radiograph August 11, 2019    TECHNIQUE: PE protocol (Postcontrast imaging from the thoracic inlet through the  hemidiaphragms in the  pulmonary arterial phase. Axial 5x5 mm soft tissue and  lung 2x2 mm images. Multiplanar 3-D volumetric MIP reconstructions through the  pulmonary arteries per protocol.) CT scanning was performed using radiation dose  reduction techniques when appropriate, per system protocols.    FINDINGS:    Pulmonary arteries: No evidence of pulmonary embolus. Evaluation of the  subsegmental branches in the lung bases is suboptimal due to extensive  respiratory motion artifact.    Lungs/Airways: Mild degree of nodular scarring in the apices. Patchy,  groundglass opacities noted in  the right greater than left lung bases. No dense  consolidations are present. Evaluation is slightly compromised due to extensive  respiratory motion artifact. There is some mild pleural-based nodularity noted  laterally in the left upper lobe (image 32). This has some associated  mineralization noted internally. This is favored to be sequela of prior  infectious/inflammatory change.    Pleura: No pleural effusion or pneumothorax.    Lymph Nodes: No mediastinal, hilar, or axillary adenopathy.    Cardiovascular: The heart is mildly enlarged. RV-LV ratio of 4. 6-4 with some  slight flattening of the interventricular septum. Mild prominence of the  pulmonary arteries as well. No pericardial effusion. Coronary artery  calcifications present. Ectatic thoracic aorta without evidence of aneurysm or  other acute aortic pathology..    Osseous structures: Degenerative and scoliotic changes of the spine. No acute  osseous abnormalities.    Upper Abdomen: Unremarkable    IMPRESSION:    No evidence of pulmonary embolus. Evaluation of the subsegmental branches in the  lung bases is suboptimal due to extensive respiratory motion artifact.    Patchy groundglass opacities noted throughout the lung bases, more so on the  right, most consistent with some nonspecific infectious/inflammatory change,  including viral pneumonia. No dense consolidations. Evaluation for  subtle  nodularity suboptimal due to the aforementioned respiratory motion artifact.    Follow-up chest CT to ensure resolution is recommended, particularly given the  degree of artifact on todays study.    Mild prominence of the pulmonary arteries and right heart which can be seen with  pulmonary hypertension.  ?  Signed By: Sharlyn Bologna B  ?  **************************************************  XR Chest 1 View Portable  ?  08/11/19 10:07:59  Chest AP: 08/11/19    INDICATION:Shortness of breath (SOB).    COMPARISON: 01/22/2016    FINDINGS: Patient is rotated which somewhat limits evaluation. Bilateral patchy  airspace opacities which may represent atelectasis or infection. There is  prominent right perihilar/infrahilar opacity which may again represent  atelectasis or infection but given appearance, difficult to completely exclude  underlying mass. Short interval follow-up posttreatment radiographs are  recommended. No pneumothorax. Biapical pleural thickening. No pleural effusion.  Heart size is within normal limits.    IMPRESSION:    Patient is rotated which somewhat limits evaluation. Patchy bilateral airspace  opacities, particularly within the right perihilar and infrahilar region.  Findings are likely infectious or inflammatory. Short interval follow-up  posttreatment radiographs are recommended in 6 weeks.  ?  Signed By: Voncille Lo      Physical Examination     Patient is an elderly female with evidence of pulse oximetry nonambulatory of 91 to 93% on room air.  She otherwise has some dry mucous membranes with evidence of some slight tachycardia in the setting of not being febrile.  Lungs otherwise are noted to be clear without any wheezing.  Cardiovascular exam reveals again tachycardia without any murmur thrill or gallop.  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          Reexamination/ Reevaluation   Vital signs   Basic Oxygen Information    08/11/2019 7:41 EST Oxygen Therapy Room air    SpO2 91 %  LOW   08/11/2019 7:32 EST Oxygen Therapy Nasal  cannula    Oxygen Flow Rate 2 L/min    SpO2 93 %   08/11/2019 7:27 EST Oxygen Therapy Nasal  cannula    Oxygen Flow  Rate 2 L/min    SpO2 96 %      Patient is a borderline candidate for Bamlan with evidence of hypoxemia on a pulse oximetry however not less than 90%.  She has dry mucous membranes and appears to be dehydrated and will be given a liter of normal saline along with a Bamlan  infusion in the emergency room.    Patient has evidence of hypoxemia in addition to the fact that she also has significantly dehydrated with evidence of a sodium of 152 with a BUN that is elevated and a creatinine of 1.7.  This patient also has a recent UTI.  Due to the fact that she is COVID-19 positive with evidence of Covid pneumonia on CT scan and otherwise without the pulmonary embolus with significant dehydration the patient is to be admitted as she has not a candidate for Bamalan  infusion.  This patient is to be transferred to the services of the hospital at [facility] as there are no beds available that are Covid beds at [place] and Plan   Diagnosis   Dehydration (ICD10-CM E86.0, Discharge, Medical)   Plan   Condition: Stable.    Disposition: Discharged: Time  08/11/2019 12:03:00, [place]: Family, Regarding diagnosis, Regarding diagnostic results, Regarding treatment plan, Regarding prescription.    Signature Line     Electronically Signed on 08/11/2019 12:04 PM EST   ________________________________________________   Caleb Popp               Modified by: Baldo Daub V on 08/11/2019 12:04 PM EST

## 2019-08-11 NOTE — Nursing Note (Signed)
Adult Patient History Form-Text       Adult Patient History Entered On:  08/11/2019 16:07 EST    Performed On:  08/11/2019 16:06 EST by Arleta CreekNieves, RN, Jessica               General Info   Has the patient received chemotherapy or immunotherapy (cytotoxic)  in the last 48-72 hours? :   No   Is the patient currently (2-3 days) receiving radiation treatment? :   No   Arleta CreekNieves, RN, Shanda BumpsJessica - 08/11/2019 16:08 EST   Patient Identified :   Identification band   Patient Identified :   Linda Murray   Information Given By :   Self   Preferred Mode of Communication :   Verbal   Accompanied By :   None   Pregnancy Status :   N/A   Arleta CreekNieves, RN, Jessica - 08/11/2019 16:06 EST   Allergies   (As Of: 08/11/2019 16:09:48 EST)   Allergies (Active)   No Known Medication Allergies  Estimated Onset Date:   Unspecified ; Created ByHomero Fellers:   FRANK, RN, AMANDA L; Reaction Status:   Active ; Category:   Drug ; Substance:   No Known Medication Allergies ; Type:   Allergy ; Updated By:   Homero FellersFRANK, RN, Jill SideAMANDA L; Reviewed Date:   08/11/2019 16:08 EST        Medication History   Medication List   (As Of: 08/11/2019 16:09:48 EST)   Normal Order    Dextrose 5% with 0.45% NaCl intravenous solution 1,000 mL  :   Dextrose 5% with 0.45% NaCl intravenous solution 1,000 mL ; Status:   Ordered ; Ordered As Mnemonic:   D5-0.45NaCl 1,000 mL ; Simple Display Line:   250 mL/hr, IV, Stop: 08/11/19 19:52:00 EST ; Ordering Provider:   Soyla DryerHAG-MD,  MANOJ V; Catalog Code:   Dextrose 5% with 0.45% NaCl ; Order Dt/Tm:   08/11/2019 11:53:22 EST          enoxaparin 300 mg/3 mL Inj Soln 3 mL  :   enoxaparin 300 mg/3 mL Inj Soln 3 mL ; Status:   Ordered ; Ordered As Mnemonic:   enoxaparin (LOVENOX) for COVID ; Simple Display Line:   50 mg, 0.5 mL, Subcutaneous, q12hr ; Ordering Provider:   Jinny BlossomMALM-MD,  MATILDA S; Catalog Code:   enoxaparin ; Order Dt/Tm:   08/11/2019 15:27:58 EST ; Comment:   max dose = 190mg   Target Dose: enoxaparin (LOVENOX) for COVID 1 mg/kg  08/11/2019 15:27:59          aluminum  hydroxide/magnesium hydroxide/simethicone 200 mg-200 mg-20 mg/5 mL  :   aluminum hydroxide/magnesium hydroxide/simethicone 200 mg-200 mg-20 mg/5 mL ; Status:   Ordered ; Ordered As Mnemonic:   aluminum hydroxide/magnesium hydroxide/simethicone 200 mg-200 mg-20 mg/5 mL oral suspension ; Simple Display Line:   30 mL, Oral, q3hr, PRN: indigestion ; Ordering Provider:   MALM-MD,  MATILDA S; Catalog Code:   Al hydroxide/Mg hydroxide/simethicone ; Order Dt/Tm:   08/11/2019 15:28:00 EST          iopamidol 76% Inj Soln 100 mL  :   iopamidol 76% Inj Soln 100 mL ; Status:   Ordered ; Ordered As Mnemonic:   Isovue-370 ; Simple Display Line:   100 mL, IV Contrast, Once ; Ordering Provider:   CHAG-MD,  Gerre ScullMANOJ V; Catalog Code:   iopamidol ; Order Dt/Tm:   08/11/2019 09:53:41 EST          acetaminophen 500 mg Tab  :  acetaminophen 500 mg Tab ; Status:   Voided ; Ordered As Mnemonic:   acetaminophen ; Simple Display Line:   1,000 mg, 2 tabs, Oral, Once, PRN: other (see comment) ; Ordering Provider:   Soyla Dryer; Catalog Code:   acetaminophen ; Order Dt/Tm:   08/11/2019 07:44:06 EST ; Comment:   PRN for headache or fever          bamlanivimab  :   bamlanivimab ; Status:   Discontinued ; Ordered As Mnemonic:   bamlanivimab IVPB ; Simple Display Line:   700 mg, 20 mL, 270 mL/hr, IV Piggyback, Once ; Ordering Provider:   CHAG-MD,  Gerre Scull; Catalog Code:   bamlanivimab ; Order Dt/Tm:   08/11/2019 07:44:06 EST ; Comment:   MUST INFUSE using 0.20/0.22 micron in-line filter.  Flush tubing with 41ml 0.9% NS after infusion complete.          diphenhydrAMINE 50 mg/mL Inj Soln 1 mL  :   diphenhydrAMINE 50 mg/mL Inj Soln 1 mL ; Status:   Discontinued ; Ordered As Mnemonic:   diphenhydrAMINE ; Simple Display Line:   25 mg, 0.5 mL, IV Push, Once, PRN: itching/allergic reaction ; Ordering Provider:   Soyla Dryer; Catalog Code:   diphenhydrAMINE ; Order Dt/Tm:   08/11/2019 07:44:06 EST          ondansetron 2 mg/mL Inj Soln 2 mL  :    ondansetron 2 mg/mL Inj Soln 2 mL ; Status:   Discontinued ; Ordered As Mnemonic:   ondansetron ; Simple Display Line:   4 mg, 2 mL, IV Push, Once, PRN: nausea/vomiting ; Ordering Provider:   Soyla Dryer; Catalog Code:   ondansetron ; Order Dt/Tm:   08/11/2019 07:44:06 EST          Sodium Chloride 0.9% intravenous solution Bolus  :   Sodium Chloride 0.9% intravenous solution Bolus ; Status:   Completed ; Ordered As Mnemonic:   Sodium Chloride 0.9% bolus ; Simple Display Line:   1,000 mL, 1000 mL/hr, IV Piggyback, Once ; Ordering Provider:   CHAG-MD,  Hardie Shackleton V; Catalog Code:   Sodium Chloride 0.9% ; Order Dt/Tm:   08/11/2019 07:43:57 EST          denosumab  :   denosumab ; Status:   Future ; Ordered As Mnemonic:   denosumab ; Simple Display Line:   60 mg, Subcutaneous, Day of Tx, Day 1 ; Ordering Provider:   Pasty Spillers, MD, Philmore Pali; Catalog Code:   denosumab ; Order Dt/Tm:   04/22/2018 08:37:22 EDT ; Comment:   Outpatient use only          denosumab  :   denosumab ; Status:   Future ; Ordered As Mnemonic:   denosumab ; Simple Display Line:   60 mg, Subcutaneous, Day of Tx, Day 1 ; Ordering Provider:   Pasty Spillers, MD, Philmore Pali; Catalog Code:   denosumab ; Order Dt/Tm:   04/22/2018 08:37:22 EDT ; Comment:   Outpatient use only          Zero Time Placeholder  :   Zero Time Placeholder ; Status:   Future ; Ordered As Mnemonic:   Zero Time Placeholder ; Simple Display Line:   1 mL, Day of Tx, Day 1 ; Ordering Provider:   Pasty Spillers, MD, Philmore Pali; Catalog Code:   Zero Time Placeholder ; Order Dt/Tm:   04/22/2018 08:36:59 EDT          Zero  Time Placeholder  :   Zero Time Print production planner ; Status:   Future ; Ordered As Mnemonic:   Zero Time Placeholder ; Simple Display Line:   1 mL, Day of Tx, Day 1 ; Ordering Provider:   Pasty Spillers, MD, Philmore Pali; Catalog Code:   Zero Time Placeholder ; Order Dt/Tm:   04/22/2018 08:36:59 EDT            Home Meds    nystatin  :   nystatin ; Status:   Documented ; Ordered As Mnemonic:    nystatin 100,000 units/mL oral suspension ; Simple Display Line:   0 Refill(s) ; Catalog Code:   nystatin ; Order Dt/Tm:   08/11/2019 07:31:53 EST          phenyTOIN  :   phenyTOIN ; Status:   Documented ; Ordered As Mnemonic:   Dilantin 100 mg oral capsule, extended release ; Simple Display Line:   0 Refill(s) ; Catalog Code:   phenyTOIN ; Order Dt/Tm:   08/11/2019 07:31:53 EST          sulfamethoxazole-trimethoprim  :   sulfamethoxazole-trimethoprim ; Status:   Documented ; Ordered As Mnemonic:   sulfamethoxazole-trimethoprim 800 mg-160 mg oral tablet ; Simple Display Line:   0 Refill(s) ; Catalog Code:   sulfamethoxazole-trimethoprim ; Order Dt/Tm:   08/11/2019 07:31:53 EST          calcium-vitamin D  :   calcium-vitamin D ; Status:   Documented ; Ordered As Mnemonic:   Caltrate 600 + D oral tablet ; Simple Display Line:   1 tabs, Oral, BID, 60 tabs, 0 Refill(s) ; Catalog Code:   calcium-vitamin D ; Order Dt/Tm:   10/22/2017 11:51:11 EDT          omeprazole  :   omeprazole ; Status:   Documented ; Ordered As Mnemonic:   PriLOSEC ; Simple Display Line:   40 mg, Oral, Daily, 0 Refill(s) ; Catalog Code:   omeprazole ; Order Dt/Tm:   10/22/2017 11:50:56 EDT          phenyTOIN  :   phenyTOIN ; Status:   Documented ; Ordered As Mnemonic:   Dilantin 100 mg oral capsule, extended release ; Simple Display Line:   See Instructions, one capule in am and two in pm ; Ordering Provider:   Pasty Spillers, MD, Philmore Pali; Catalog Code:   phenyTOIN ; Order Dt/Tm:   10/13/2015 13:55:06 EDT          calcium carbonate  :   calcium carbonate ; Status:   Documented ; Ordered As Mnemonic:   Caltrate ; Simple Display Line:   mg, Oral, Daily, 0 Refill(s) ; Catalog Code:   calcium carbonate ; Order Dt/Tm:   10/13/2015 13:54:57 EDT          denosumab  :   denosumab ; Status:   Documented ; Ordered As Mnemonic:   Prolia 60 mg/mL subcutaneous solution ; Simple Display Line:   60 mg, 1 mL, Subcutaneous, q69mo, 1 mL, 0 Refill(s) ; Catalog Code:   denosumab ;  Order Dt/Tm:   10/13/2015 13:54:44 EDT            Problem History   (As Of: 08/11/2019 16:09:48 EST)   Problems(Active)    Aphasia (IMO  :[id number] )  Name of Problem:   Aphasia ; Recorder:   CABE, RN, ADAM Z; Confirmation:   Confirmed ; Classification:   Medical ; Code:   [id number] ; Contributor System:   Dietitian ;  Last Updated:   01/22/2016 18:29 EDT ; Life Cycle Date:   01/22/2016 ; Life Cycle Status:   Active ; Vocabulary:   IMO        Apraxia (IMO  :[id number] )  Name of Problem:   Apraxia ; Recorder:   CABE, RN, ADAM Z; Confirmation:   Confirmed ; Classification:   Medical ; Code:   [id number] ; Contributor System:   Dietitian ; Last Updated:   01/22/2016 18:29 EDT ; Life Cycle Date:   01/22/2016 ; Life Cycle Status:   Active ; Vocabulary:   IMO        CVA (cerebral vascular accident) (IMO  :[id number] )  Name of Problem:   CVA (cerebral vascular accident) ; Recorder:   Homero Fellers, RN, AMANDA L; Confirmation:   Confirmed ; Classification:   Medical ; Code:   [phone] ; Contributor System:   Dietitian ; Last Updated:   07/20/2015 19:30 EST ; Life Cycle Date:   07/20/2015 ; Life Cycle Status:   Active ; Vocabulary:   IMO        Hx of seizure disorder (SNOMED CT  :163846659 )  Name of Problem:   Hx of seizure disorder ; Recorder:   DORKEWITZ, RN, BRIDGET A; Confirmation:   Confirmed ; Classification:   Patient Stated ; Code:   [id number] ; Contributor System:   PowerChart ; Last Updated:   10/22/2017 11:25 EDT ; Life Cycle Date:   10/22/2017 ; Life Cycle Status:   Active ; Vocabulary:   SNOMED CT        Hyperlipidemia (SNOMED CT  :39030092 )  Name of Problem:   Hyperlipidemia ; Recorder:   DORKEWITZ, RN, BRIDGET A; Confirmation:   Confirmed ; Classification:   Patient Stated ; Code:   [id number] ; Contributor System:   PowerChart ; Last Updated:   10/22/2017 11:24 EDT ; Life Cycle Date:   10/22/2017 ; Life Cycle Status:   Active ; Vocabulary:   SNOMED CT        Shortness of breath (IMO  :63335 )  Name of Problem:   Shortness of breath ; Recorder:    SYSTEM,  SYSTEM; Confirmation:   Confirmed ; Classification:   Patient Stated ; Code:   45625 ; Last Updated:   08/11/2019 11:54 EST ; Life Cycle Date:   08/11/2019 ; Life Cycle Status:   Active ; Vocabulary:   IMO        UI (urinary incontinence) (SNOMED CT  :6389373428 )  Name of Problem:   UI (urinary incontinence) ; Recorder:   DORKEWITZ, RN, BRIDGET A; Confirmation:   Confirmed ; Classification:   Patient Stated ; Code:   [id number] ; Contributor System:   PowerChart ; Last Updated:   10/22/2017 11:25 EDT ; Life Cycle Date:   10/22/2017 ; Life Cycle Status:   Active ; Vocabulary:   SNOMED CT          Procedure History        -    Procedure History   (As Of: 08/11/2019 16:09:48 EST)     Procedure Dt/Tm:   01/22/2016 21:00:00 EDT ; Location:   [facility] ; Provider:   Laren Boom; Anesthesia Type:   Monitored Anesthesia Care ; :   Helyn Numbers,  JEFFREY DEAN; Anesthesia Minutes:   0 ; Procedure Name:   Esophagastroduodenoscopy with Removal Foreign Body ; Procedure Minutes:   6 ; Comments:     01/22/2016 21:27 EDT - Urbano Heir, RN, DONNA J  auto-populated from documented surgical case ; Clinical Service:   Surgery ; Last Reviewed Dt/Tm:   08/11/2019 16:08:30 EST            Anesthesia Minutes:   0 ; Procedure Name:   Brain ; Procedure Minutes:   0 ; Comments:     10/22/2017 11:26 EDT - Ocie Cornfield, RN, Marland A  surgery [birth year]1984 ; Last Reviewed Dt/Tm:   08/11/2019 16:08:30 EST            Immunizations   Influenza Vaccine Status :   Received prior to admission, during current flu season   Influenza Vaccine Status :   Felicity Pellegrini, RN, Janett Billow - 08/11/2019 16:06 EST   ID Risk Screen Symptoms   Recent Travel History :   No recent travel   Close Contact with COVID-19 ID :   No   Last 14 days COVID-19 ID :   Yes - Detected (positive)   TB Symptom Screen :   No symptoms   C. diff Symptom/History ID :   Neither of the above   Patient Pregnant :   None of the above   MRSA/VRE Screening :   None of these apply   CRE Screening  :   No   Yetta Numbers - 08/11/2019 16:08 EST   Bloodless Medicine   Is Blood Transfusion Acceptable to Patient :   Truitt Leep, RN, Janett Billow - 08/11/2019 16:08 EST   Nutrition   MST Does Your Current Diet Include :   None   MST Have You Recently Lost Weight Without Trying? :   No   MST Weight Loss Score :   0    MST Have You Been Eating Poorly? :   No   MST Appetite Score :   0    MST Score :   0    MST Interpretation :   Not at risk   Yetta Numbers - 08/11/2019 16:08 EST   Functional   Sensory Deficits :   Speech deficit   ADLs Prior to Admission :   Minimal assistance   [place], RN, Janett Billow - 08/11/2019 16:08 EST   Social History   Social History   (As Of: 08/11/2019 16:09:48 EST)   Tobacco:        Former smoker, Cigarettes   (Last Updated: 01/22/2016 18:29:32 EDT by Zara Council, RN, ADAM Z)          Alcohol:        Denies   (Last Updated: 10/22/2017 11:28:05 EDT by Ocie Cornfield, RN, BRIDGET A)            Spiritual   Faith/Denomination :   None specified   Do you have a concern that you would like to address with a Chaplain? :   No   Do you have any religious/spiritual/cultural beliefs that could impact the way your care is provided? :   No   Debby Bud, RN, Janett Billow - 08/11/2019 16:08 EST   Harm Screen   Injuries/Abuse/Neglect in Household :   Denies   Feels Unsafe at Home :   No   Last 3 mo, thoughts killing self/others :   Patient denies   Yetta Numbers - 08/11/2019 16:08 EST   Advance Directive   Location of Advance Directive :   Unable to obtain copy   Advance Directive :   Yes   Patient Wishes to Receive Further Information on Advance Directives :   No   [place],  RN, Shanda BumpsJessica - 08/11/2019 16:08 EST   Education   Written Language :   [place] PondsEnglish   Caregiver/Advocate Primary Language :   Lenox PondsEnglish   Caregiver/Advocate Written Language :   Lenox PondsEnglish   Primary Language :   Leonard SchwartzEnglish   Nieves, RN, Jessica - 08/11/2019 16:08 EST   Caregiver/Advocate Language   Patient :   None   Family :   None   Arleta CreekNieves, RN, Shanda BumpsJessica - 08/11/2019 16:08  EST   Barriers to Learning :   Acuity of Illness   Amparo Bristolieves, RN, Jessica - 08/11/2019 16:08 EST   DC Needs   CM Living Situation :   Family support   Amparo Bristolieves, RN, Jessica - 08/11/2019 16:08 EST   Valuables and Belongings   Does Patient Have Valuables and Belongings :   Yes   Arleta CreekNieves, RN, Shanda BumpsJessica - 08/11/2019 16:08 EST   Valuables and Belongings   At Bedside :   Clothes, Jewelry, pants, shirt, undergarments, watch   Amparo Bristolieves, RN, Jessica - 08/11/2019 16:08 EST   Patient Search Completed :   Leane PlattNA   Nieves, RShanda Bumps, Jessica - 08/11/2019 16:08 EST   Admission Complete   Admission Complete :   Yes   Amparo Bristolieves, RN, Jessica - 08/11/2019 16:06 EST

## 2019-08-11 NOTE — ED Notes (Signed)
ED Triage Note       ED Triage Adult Entered On:  08/11/2019 7:31 EST    Performed On:  08/11/2019 7:27 EST by BELLEW, RN, KRISTY M               Triage   Chief Complaint :   Pt sent for infusion of Bamlan after positive covid test, pt has dry mucous membranes, initial oxygen sats on RA 88%, feels weak, but denies other complaints.   Numeric Rating Pain Scale :   0 = No pain   [place] Mode of Arrival :   Wheelchair   Infectious Disease Documentation :   Document assessment   Temperature Oral :   36.3 degC(Converted to: 97.3 degF)    Heart Rate Monitored :   105 bpm (HI)    Respiratory Rate :   24 br/min (HI)    Systolic Blood Pressure :   948 mmHg   Diastolic Blood Pressure :   67 mmHg   SpO2 :   96 %   Oxygen Therapy :   Nasal  cannula   Oxygen Flow Rate :   2 L/min   Patient presentation :   HR > 100, RR > 22   Chief Complaint or Presentation suggest infection :   No   Weight Dosing :   45 kg(Converted to: 99 lb 3 oz)    Height :   158 cm(Converted to: 5 ft 2 in)    Body Mass Index Dosing :   18 kg/m2   BELLEW, RN, KRISTY M - 08/11/2019 7:27 EST   BELLEW, RN, KRISTY M - 08/11/2019 7:27 EST   DCP GENERIC CODE   Tracking Group :   ED [address] [employer] [employer]   [place], RN, [place] M - 08/11/2019 7:27 EST   Tracking Acuity :   3   BELLEW, RN, KRISTY M - 08/11/2019 7:32 EST     ED General Section :   Document assessment   Pregnancy Status :   N/A   ED Allergies Section :   Document assessment   ED Reason for Visit Section :   Document assessment   ED Home Meds Section :   Document assessment   BELLEW, RN, KRISTY M - 08/11/2019 7:27 EST   ID Risk Screen Symptoms   Recent Travel History :   No recent travel   Close Contact with COVID-19 ID :   No   Last 14 days COVID-19 ID :   Yes - Detected (positive)   BELLEW, RN, KRISTY M - 08/11/2019 7:27 EST   Allergies   (As Of: 08/11/2019 07:31:58 EST)   Allergies (Active)   No Known Medication Allergies  Estimated Onset Date:   Unspecified ; Created ByPilar Plate, RN, AMANDA L; Reaction  Status:   Active ; Category:   Drug ; Substance:   No Known Medication Allergies ; Type:   Allergy ; Updated By:   Pilar Plate, RN, Park Pope; Reviewed Date:   08/11/2019 7:31 EST        Psycho-Social   Last 3 mo, thoughts killing self/others :   Patient denies   BELLEW, RN, Sharlynn Oliphant - 08/11/2019 7:27 EST   ED Home Med List   Medication List   (As Of: 08/11/2019 07:31:58 EST)   Normal Order    denosumab  :   denosumab ; Status:   Future ; Ordered As Mnemonic:   denosumab ; Simple Display Line:   60 mg,  Subcutaneous, Day of Tx, Day 1 ; Ordering Provider:   Pasty Spillers, MD, Philmore Pali; Catalog Code:   denosumab ; Order Dt/Tm:   04/22/2018 08:37:22 EDT ; Comment:   Outpatient use only          denosumab  :   denosumab ; Status:   Future ; Ordered As Mnemonic:   denosumab ; Simple Display Line:   60 mg, Subcutaneous, Day of Tx, Day 1 ; Ordering Provider:   Pasty Spillers, MD, Philmore Pali; Catalog Code:   denosumab ; Order Dt/Tm:   04/22/2018 08:37:22 EDT ; Comment:   Outpatient use only          Zero Time Placeholder  :   Zero Time Placeholder ; Status:   Future ; Ordered As Mnemonic:   Zero Time Placeholder ; Simple Display Line:   1 mL, Day of Tx, Day 1 ; Ordering Provider:   Pasty Spillers, MD, Philmore Pali; Catalog Code:   Zero Time Placeholder ; Order Dt/Tm:   04/22/2018 08:36:59 EDT          Zero Time Placeholder  :   Zero Time Placeholder ; Status:   Future ; Ordered As Mnemonic:   Zero Time Placeholder ; Simple Display Line:   1 mL, Day of Tx, Day 1 ; Ordering Provider:   Pasty Spillers, MD, Philmore Pali; Catalog Code:   Zero Time Placeholder ; Order Dt/Tm:   04/22/2018 08:36:59 EDT            Home Meds    sulfamethoxazole-trimethoprim  :   sulfamethoxazole-trimethoprim ; Status:   Documented ; Ordered As Mnemonic:   sulfamethoxazole-trimethoprim 800 mg-160 mg oral tablet ; Simple Display Line:   0 Refill(s) ; Catalog Code:   sulfamethoxazole-trimethoprim ; Order Dt/Tm:   08/11/2019 07:31:53 EST          nystatin  :   nystatin ; Status:    Documented ; Ordered As Mnemonic:   nystatin 100,000 units/mL oral suspension ; Simple Display Line:   0 Refill(s) ; Catalog Code:   nystatin ; Order Dt/Tm:   08/11/2019 07:31:53 EST          phenyTOIN  :   phenyTOIN ; Status:   Documented ; Ordered As Mnemonic:   Dilantin 100 mg oral capsule, extended release ; Simple Display Line:   0 Refill(s) ; Catalog Code:   phenyTOIN ; Order Dt/Tm:   08/11/2019 07:31:53 EST          omeprazole  :   omeprazole ; Status:   Documented ; Ordered As Mnemonic:   PriLOSEC ; Simple Display Line:   40 mg, Oral, Daily, 0 Refill(s) ; Catalog Code:   omeprazole ; Order Dt/Tm:   10/22/2017 11:50:56 EDT          calcium-vitamin D  :   calcium-vitamin D ; Status:   Documented ; Ordered As Mnemonic:   Caltrate 600 + D oral tablet ; Simple Display Line:   1 tabs, Oral, BID, 60 tabs, 0 Refill(s) ; Catalog Code:   calcium-vitamin D ; Order Dt/Tm:   10/22/2017 11:51:11 EDT          phenyTOIN  :   phenyTOIN ; Status:   Documented ; Ordered As Mnemonic:   Dilantin 100 mg oral capsule, extended release ; Simple Display Line:   See Instructions, one capule in am and two in pm ; Ordering Provider:   Pasty Spillers, MD, Philmore Pali; Catalog Code:   phenyTOIN ; Order Dt/Tm:   10/13/2015  13:55:06 EDT          calcium carbonate  :   calcium carbonate ; Status:   Documented ; Ordered As Mnemonic:   Caltrate ; Simple Display Line:   mg, Oral, Daily, 0 Refill(s) ; Catalog Code:   calcium carbonate ; Order Dt/Tm:   10/13/2015 13:54:57 EDT          denosumab  :   denosumab ; Status:   Documented ; Ordered As Mnemonic:   Prolia 60 mg/mL subcutaneous solution ; Simple Display Line:   60 mg, 1 mL, Subcutaneous, q68mo, 1 mL, 0 Refill(s) ; Catalog Code:   denosumab ; Order Dt/Tm:   10/13/2015 13:54:44 EDT            ED Reason for Visit   (As Of: 08/11/2019 07:31:58 EST)   Problems(Active)    Aphasia (IMO  :[id number] )  Name of Problem:   Aphasia ; Recorder:   CABE, RN, ADAM Z; Confirmation:   Confirmed ; Classification:   Medical ;  Code:   [id number] ; Contributor System:   Dietitian ; Last Updated:   01/22/2016 18:29 EDT ; Life Cycle Date:   01/22/2016 ; Life Cycle Status:   Active ; Vocabulary:   IMO        Apraxia (IMO  :G166641 )  Name of Problem:   Apraxia ; Recorder:   CABE, RN, ADAM Z; Confirmation:   Confirmed ; Classification:   Medical ; Code:   G166641 ; Contributor System:   Dietitian ; Last Updated:   01/22/2016 18:29 EDT ; Life Cycle Date:   01/22/2016 ; Life Cycle Status:   Active ; Vocabulary:   IMO        CVA (cerebral vascular accident) (IMO  :355732 )  Name of Problem:   CVA (cerebral vascular accident) ; Recorder:   Homero Fellers, RN, AMANDA L; Confirmation:   Confirmed ; Classification:   Medical ; Code:   [phone] ; Contributor System:   Dietitian ; Last Updated:   07/20/2015 19:30 EST ; Life Cycle Date:   07/20/2015 ; Life Cycle Status:   Active ; Vocabulary:   IMO        Hx of seizure disorder (SNOMED CT  :706237628 )  Name of Problem:   Hx of seizure disorder ; Recorder:   DORKEWITZ, RN, BRIDGET A; Confirmation:   Confirmed ; Classification:   Patient Stated ; Code:   [id number] ; Contributor System:   PowerChart ; Last Updated:   10/22/2017 11:25 EDT ; Life Cycle Date:   10/22/2017 ; Life Cycle Status:   Active ; Vocabulary:   SNOMED CT        Hyperlipidemia (SNOMED CT  :73710626 )  Name of Problem:   Hyperlipidemia ; Recorder:   DORKEWITZ, RN, BRIDGET A; Confirmation:   Confirmed ; Classification:   Patient Stated ; Code:   [id number] ; Contributor System:   PowerChart ; Last Updated:   10/22/2017 11:24 EDT ; Life Cycle Date:   10/22/2017 ; Life Cycle Status:   Active ; Vocabulary:   SNOMED CT        UI (urinary incontinence) (SNOMED CT  :0350093818 )  Name of Problem:   UI (urinary incontinence) ; Recorder:   DORKEWITZ, RN, BRIDGET A; Confirmation:   Confirmed ; Classification:   Patient Stated ; Code:   [id number] ; Contributor System:   PowerChart ; Last Updated:   10/22/2017 11:25 EDT ; Life Cycle Date:   10/22/2017 ; Life Cycle Status:  Active ;  Vocabulary:   SNOMED CT          Diagnoses(Active)    Medication administration  Date:   08/11/2019 ; Diagnosis Type:   Reason For Visit ; Confirmation:   Complaint of ; Clinical Dx:   Medication administration ; Classification:   Medical ; Clinical Service:   Emergency medicine ; Code:   PNED ; Probability:   0 ; Diagnosis Code:   [id number]

## 2019-08-11 NOTE — Discharge Summary (Signed)
 ED Clinical Summary                     [facility]  [address]  [place], [place] [address]  [phone]          PERSON INFORMATION  Name: Linda, Murray Age:  79 Years DOB: 1941/02/03   Sex: Female Language: English PCP: ADRIANE CRANK SEA   Marital Status: Widowed Phone: [phone] Med Service: MED-Medicine   MRN: [id number] Acct# [id number] Arrival: 08/11/2019 07:18:00   Visit Reason: Medication administration; [facility] INFUSION Acuity: 3 LOS: 000 07:00   Address:    [address] [place] [address]   Diagnosis:    Dehydration; Hypernatremia; Pneumonia due to COVID-19 virus  Medications:          Medications that have not changed  Other Medications  calcium carbonate (Caltrate) Oral (given by mouth) every day.  Last Dose:____________________  calcium-vitamin D (Caltrate 600 + D oral tablet) 1 Tabs Oral (given by mouth) 2 times a day.  Last Dose:____________________  denosumab  (Prolia  60 mg/mL subcutaneous solution) 1 Milliliter Subcutaneous (under the skin) every 6 months.  Last Dose:____________________  nystatin  (nystatin  100,000 units/mL oral suspension)   Last Dose:____________________  omeprazole (PriLOSEC) 40 Milligram Oral (given by mouth) every day.  Last Dose:____________________  phenyTOIN (Dilantin 100 mg oral capsule, extended release)   Last Dose:____________________  phenyTOIN (Dilantin 100 mg oral capsule, extended release) one capule in am and two in pm.  Last Dose:____________________  sulfamethoxazole-trimethoprim (sulfamethoxazole-trimethoprim 800 mg-160 mg oral tablet)   Last Dose:____________________      Medications Administered During Visit:                Medication Dose Route   Sodium Chloride 0.9% 1000 mL IV Piggyback   Dextrose 5% with 0.45% NaCl 1000 mL Initial Volume  250 mL/hr IV  Forearm, Mid Left               Allergies      No Known Medication Allergies      Major Tests and Procedures:  The following procedures and tests were performed  during your ED visit.  COMMON PROCEDURES%>  COMMON PROCEDURES COMMENTS%>                PROVIDER INFORMATION               Provider Role Assigned Sampson HERD, [facility] ED Provider 08/11/2019 07:23:46    Onita, RN, Sherrilyn LABOR ED Nurse 08/11/2019 07:35:19    ORION, RN, DELON CROME ED Nurse 08/11/2019 11:10:24        Attending Physician:  JEANIE ALONSO RAMAN      Admit Doc  CHAG-MD,  MANOJ V     Consulting Doc  MALM-MD,  MATILDA S     VITALS INFORMATION  Vital Sign Triage Latest   Temp Oral ORAL_1%> ORAL%>   Temp Temporal TEMPORAL_1%> TEMPORAL%>   Temp Intravascular INTRAVASCULAR_1%> INTRAVASCULAR%>   Temp Axillary AXILLARY_1%> AXILLARY%>   Temp Rectal RECTAL_1%> RECTAL%>   02 Sat 96 % 94 %   Respiratory Rate RATE_1%> RATE%>   Peripheral Pulse Rate PULSE RATE_1%>105 bpm PULSE RATE%>   Apical Heart Rate HEART RATE_1%> HEART RATE%>   Blood Pressure BLOOD PRESSURE_1%>/ BLOOD PRESSURE_1%>67 mmHg BLOOD PRESSURE%> / BLOOD PRESSURE%>87 mmHg                 Immunizations      No Immunizations Documented This  Visit          DISCHARGE INFORMATION   Discharge Disposition: [facility]   Discharge Location:  [facility]   Discharge Date and Time:  08/11/2019 14:18:49   ED Checkout Date and Time:  08/11/2019 14:18:49     DEPART REASON INCOMPLETE INFORMATION               Depart Action Incomplete Reason   Interactive View/I&O Recently assessed   Patient Education Recently assessed   Follow-up Recently assessed               Problems      Active           Aphasia          Apraxia          CVA (cerebral vascular accident)              Smoking Status      Former smoker         PATIENT EDUCATION INFORMATION  Instructions:          Follow up:            ED PROVIDER DOCUMENTATION     Patient:   Linda Murray, Linda Murray             MRN: [id number]            FIN: [id number]               Age:   34 years     Sex:  Female     DOB:  18-Jul-1941   Associated Diagnoses:   Dehydration   Author:   RODGERS ILA GAILS      Basic Information    Addendum: Time of addendum:: 08/11/2019 07:46:00 , Pertinent history: Pt. is awaiting Bamlan infusion.      Medical Decision Making   Results review:  Lab results : Lab View   08/11/2019 9:18 EST Lactic Acid Lvl 1.5 mmol/L   08/11/2019 8:29 EST Estimated Creatinine Clearance 20.59 mL/min   08/11/2019 8:02 EST WBC 8.5 x10e3/mcL    RBC 4.80 x10e6/mcL    Hgb 16.0 g/dL  HI    HCT 50.7 %  HI    MCV 102.5 fL  HI    MCH 33.3 pg    MCHC 32.5 g/dL    RDW 86.2 %    Platelet 185 x10e3/mcL    MPV 11.9 fL    Neutro Auto 81.0 %  HI    Neutro Absolute 6.9 x10e3/mcL    Immature Grans Percent 0.6 %    Immature Grans Absolute 0.05 x10e3/mcL    Lymph Auto 13.0 %  LOW    Lymph Absolute 1.1 x10e3/mcL    Mono Auto 5.3 %    Mono Absolute 0.5 x10e3/mcL    Eosinophil Percent 0.0 %    Eos Absolute 0.0 x10e3/mcL    Basophil Auto 0.1 %    Baso Absolute 0.0 x10e3/mcL    NRBC Absolute Auto 0.000 x10e3/mcL    NRBC Percent Auto 0.0 %    D Dimer 2.79 mcg/mL FEU  HI    Sodium Lvl 152 mmol/L  HI    Potassium Lvl 4.0 mmol/L    Chloride 117 mmol/L  HI    CO2 19 mmol/L  LOW    Glucose Random 125 mg/dL  HI    BUN 41 mg/dL  HI    Creatinine Lvl 1.6 mg/dL  HI    AGAP 16 mmol/L  Osmolality Calc 313 mOsm/kg  HI    Calcium Lvl 8.9 mg/dL    eGFR AA 35 fO/fpw/8.26f  LOW    eGFR Non-AA 31 mL/min/1.73m  LOW   08/11/2019 7:31 EST Estimated Creatinine Clearance 32.94 mL/min   .   Radiology results:  Rad Results (ST)   CT Angiography Chest w/ + w/o Contrast  ?  08/11/19 10:58:27  CTA chest: 08/11/19    INDICATION:PE suspected, high pretest prob.    COMPARISON: Chest radiograph August 11, 2019    TECHNIQUE: PE protocol (Postcontrast imaging from the thoracic inlet through the  hemidiaphragms in the pulmonary arterial phase. Axial 5x5 mm soft tissue and  lung 2x2 mm images. Multiplanar 3-D volumetric MIP reconstructions through the  pulmonary arteries per protocol.) CT scanning was performed using radiation dose  reduction techniques when appropriate, per system  protocols.    FINDINGS:    Pulmonary arteries: No evidence of pulmonary embolus. Evaluation of the  subsegmental branches in the lung bases is suboptimal due to extensive  respiratory motion artifact.    Lungs/Airways: Mild degree of nodular scarring in the apices. Patchy,  groundglass opacities noted in the right greater than left lung bases. No dense  consolidations are present. Evaluation is slightly compromised due to extensive  respiratory motion artifact. There is some mild pleural-based nodularity noted  laterally in the left upper lobe (image 32). This has some associated  mineralization noted internally. This is favored to be sequela of prior  infectious/inflammatory change.    Pleura: No pleural effusion or pneumothorax.    Lymph Nodes: No mediastinal, hilar, or axillary adenopathy.    Cardiovascular: The heart is mildly enlarged. RV-LV ratio of 4. 6-4 with some  slight flattening of the interventricular septum. Mild prominence of the  pulmonary arteries as well. No pericardial effusion. Coronary artery  calcifications present. Ectatic thoracic aorta without evidence of aneurysm or  other acute aortic pathology..    Osseous structures: Degenerative and scoliotic changes of the spine. No acute  osseous abnormalities.    Upper Abdomen: Unremarkable    IMPRESSION:    No evidence of pulmonary embolus. Evaluation of the subsegmental branches in the  lung bases is suboptimal due to extensive respiratory motion artifact.    Patchy groundglass opacities noted throughout the lung bases, more so on the  right, most consistent with some nonspecific infectious/inflammatory change,  including viral pneumonia. No dense consolidations. Evaluation for subtle  nodularity suboptimal due to the aforementioned respiratory motion artifact.    Follow-up chest CT to ensure resolution is recommended, particularly given the  degree of artifact on todays study.    Mild prominence of the pulmonary arteries and right heart which can  be seen with  pulmonary hypertension.  ?  Signed By: DARROW NED B  ?  **************************************************  XR Chest 1 View Portable  ?  08/11/19 10:07:59  Chest AP: 08/11/19    INDICATION:Shortness of breath (SOB).    COMPARISON: 01/22/2016    FINDINGS: Patient is rotated which somewhat limits evaluation. Bilateral patchy  airspace opacities which may represent atelectasis or infection. There is  prominent right perihilar/infrahilar opacity which may again represent  atelectasis or infection but given appearance, difficult to completely exclude  underlying mass. Short interval follow-up posttreatment radiographs are  recommended. No pneumothorax. Biapical pleural thickening. No pleural effusion.  Heart size is within normal limits.    IMPRESSION:    Patient is rotated which somewhat limits evaluation. Patchy bilateral airspace  opacities, particularly within the  right perihilar and infrahilar region.  Findings are likely infectious or inflammatory. Short interval follow-up  posttreatment radiographs are recommended in 6 weeks.  ?  Signed By: NORVAL HOY LOISE SABRA      Physical Examination     Patient is an elderly female with evidence of pulse oximetry nonambulatory of 91 to 93% on room air.  She otherwise has some dry mucous membranes with evidence of some slight tachycardia in the setting of not being febrile.  Lungs otherwise are noted to be clear without any wheezing.  Cardiovascular exam reveals again tachycardia without any murmur thrill or gallop.  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          Reexamination/ Reevaluation   Vital signs   Basic Oxygen Information   08/11/2019 7:41 EST Oxygen Therapy Room air    SpO2 91 %  LOW   08/11/2019 7:32 EST Oxygen Therapy Nasal  cannula    Oxygen Flow Rate 2 L/min    SpO2 93 %   08/11/2019 7:27 EST Oxygen Therapy Nasal  cannula    Oxygen Flow Rate 2 L/min    SpO2 96 %      Patient is a borderline candidate  for Bamlan with evidence of hypoxemia on a pulse oximetry however not less than 90%.  She has dry mucous membranes and appears to be dehydrated and will be given a liter of normal saline along with a Bamlan  infusion in the emergency room.    Patient has evidence of hypoxemia in addition to the fact that she also has significantly dehydrated with evidence of a sodium of 152 with a BUN that is elevated and a creatinine of 1.7.  This patient also has a recent UTI.  Due to the fact that she is COVID-19 positive with evidence of Covid pneumonia on CT scan and otherwise without the pulmonary embolus with significant dehydration the patient is to be admitted as she has not a candidate for Bamalan  infusion.  This patient is to be transferred to the services of the hospital at [facility] as there are no beds available that are Covid beds at [facility]. [place]      Impression and Plan   Diagnosis   Dehydration (ICD10-CM E86.0, Discharge, Medical)   Plan   Condition: Stable.    Disposition: Discharged: Time  08/11/2019 12:03:00, [facility].    Counseled: Family, Regarding diagnosis, Regarding diagnostic results, Regarding treatment plan, Regarding prescription.

## 2019-08-11 NOTE — H&P (Signed)
History and Physical               [facility]  Linda Fuse, MD  Admission Date: 08/11/2019    ADMITTING PHYSICIAN:  Dr. Laurence Spates.    CHIEF COMPLAINT:  Weakness.    PRIMARY CARE PHYSICIAN:  Dr. Bettey Costa.    ADMISSION STATUS:  I expect that the patient will require a 2-midnight  stay in the hospital.    ADVANCE DIRECTIVES:  The patient will be a Full Code status.    ADMISSION STATUS:  I expect that the patient will require a 2-midnight  stay in the hospital.    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  She has been  living in an independent living facility for 25-30 years.  At  baseline, she is able to take care of herself.  Her meals are prepared  and her room is cleaned for her, but she is able to dress up and  perform most of her activities of daily living without problems.  The  history was obtained from the son because the patient has significant  aphasia and is difficult to comprehend.      According to the son, about 8 days ago, he saw her having a hard time  getting out of a chair.  He was later called by the facility 3 days  ago that she was weak.  He therefore took her to see her primary care  physician 2 days ago where she was found to have a urinary tract  infection but physician felt that this was likely the reason for her  symptoms and started her on Bactrim.  She also performed a COVID test,  which came back positive.  She has not been able to eat or drink.  She  has been significantly weak.  Her son sent her up to get the Bamlanivumab   infusion but, when she got to the ER, she was found to have  electrolyte abnormalities and hypoxia and as a result has been  admitted to the hospitalist service.  She did not receive the Bamlanivumab  infusion.    PAST MEDICAL HISTORY:  Aneurysm 35 years ago that was repaired.   Subsequent aphasia,  apraxia, right-sided hemiparesis.  Also on seizure  prophylaxis but, in the last 35 years or so, according to the son, she  has not had a single seizure.    PAST SURGICAL HISTORY:  She has undergone esophageal dilatation in the  recent past.    ALLERGIES:  No known medication allergies.    HOME MEDICATIONS:  She is on Dilantin 100 mg b.i.d. and omeprazole 40  mg.    REVIEW OF SYSTEMS:  Could not be obtained due to the patient's  aphasia.    FAMILY HISTORY:  No pertinent family history.    SOCIAL HISTORY:  The patient lives in an independent facility.  No  smoking, alcohol or illicit drugs.    PHYSICAL EXAMINATION:  VITAL SIGNS:  Blood pressure 117/78, heart rate  is in the 90s.  Oxygen saturation 91% on room air.  At rest, her  oxygen sats ranged from 90-94%.  However, en route to the hospital, in  the Emergency Room, it was reported that her oxygen saturation dropped  to the 80s with some exertion.  CONSTITUTIONAL:  The patient is in no  acute  distress, although she keeps moving around and tries to  communicate something to [place], which is difficult to understand.  She is  awake.  She is not obese.  She responds yes or no to questions asked.   EYES:  Her pupils are equal, round and reactive to light.  She has  intact accommodation.  HEENT:  Oral mucosa appears dry.  Normal  hearing, not using hearing aids.  NECK:  Supple, no distended neck  veins.  RESPIRATORY:  She has normal respiratory effort.  She is  currently not using accessory muscles.  LUNGS:  Sound decreased  bilaterally.  CARDIOVASCULAR:  Regular rate and rhythm, no murmurs  noted.  No rubs, no gallops.  PMI is not displaced.  CHEST WALL:  She  has no chest wall deformity.  ABDOMEN:  Soft, nontender, nondistended.   MUSCULOSKELETAL:  No joint swelling or erythema.  She has a  contracture of her right upper extremity.  NEUROLOGICAL:  She is  awake, difficult to ascertain her orientation, but she does appear to  be aware of her surroundings.  She keeps  motioning pointing to things  and she answers yes or no to questions asked and they appear  appropriate.  She does have weakness of her right upper and right  lower extremities.  PSYCHIATRIC:  She is cooperative, displays  appropriate mood given her mental her overall disposition.    LABORATORY DATA:  No leukocytosis.  Hemoglobin is 16, platelet count  is 185.  D-dimer is 279.  Chemistry:  Sodium was 151.  Initially,  repeat sodium 149, creatinine was 1.6, repeat creatinine 1.3.  Lactic  is 1.4.  C-reactive protein 9.57.  D-dimer 2.79.  She had a urine  culture on 119 growing E. coli, which is pansensitive.    IMAGING:  She had a CT chest, shows no evidence of pulmonary embolus.   Evaluation of the segmental branches in the lung bases are suboptimal  due to extensive respiratory motion artifact, patchy ground-glass  opacities noted throughout the lung bases, more so on the right, most  consistent with some nonspecific infectious/inflammatory change  including viral pneumonia.  No dense consolidations.  Evaluation for  subtle nodularity suboptimal due to the aforementioned respiratory  motion artifact.  Mild prominence of the pulmonary arteries of the  right heart, which can be seen with pulmonary hypertension.    ASSESSMENT AND PLAN:  1.  COVID-19 disease.  From the history obtained and it appears her  symptoms may have begun 8 days ago but again this is difficult to  ascertain.  She is currently hypoxic with exertion, so she will be  started on Decadron.  ID will be consulted for consideration of  remdesivir.  Given her elevated D-dimer, she will be started on  Lovenox for hypercoagulability of COVID.  2.  Hypernatremia.  This is because the patient has not been eating  and drinking well.  Her sodium is already improving with half-normal  saline.  We will continue that.  3.  Acute kidney injury.  I expect continued improvement in her  creatinine and renal function with IV fluid hydration.  Continue  half-normal  saline.  Encourage oral intake as much as possible.  4.  Escherichia coli urinary tract infection.  We will switch Bactrim  due to kidney dysfunction to  Augmentin.  She probably needs 1 or 2  more days treatment.  5.  Seizure disorder.  Will resume Dilantin.  Interestingly, she has  never had a  seizure since her aneurysm 35 years ago.  She will need  followup with neurology to determine whether to continue the Dilantin  or not.  6.  Deep venous thrombosis prophylaxis.  She is on Lovenox treatment  dose.  7.  Gastroesophageal reflux disease.    Overall, time spent on this patient's admission, 53 minutes.      Linda Fuse, MD  TR: hn DD: 08/11/2019 19:22 TD: 08/11/2019 20:51 Job#: [id number]  \\[id number]\\DOC#: [id number]  \\[id number]\\    Signature Line     Electronically Signed on 08/17/2019 10:55 AM EST   ________________________________________________   Jinny Blossom S               Modified by: Susa Raring on 08/12/2019 03:29 PM EST

## 2019-08-11 NOTE — ED Notes (Signed)
ED Patient Education Note     Patient Education Materials Follows:

## 2019-08-11 NOTE — ED Notes (Signed)
 ED Patient Summary              [facility] Emergency Department  [address], [place] [address]  [phone]  Discharge Instructions (Patient)  _______________________________________     Name: Linda Murray, Linda Murray  DOB: 06-18-1941                   MRN: [id number]                   FIN: [id number]  Reason For Visit: Medication administration; [facility] INFUSION  Final Diagnosis: Dehydration; Hypernatremia; Pneumonia due to COVID-19 virus     Visit Date: 08/11/2019 07:18:00  Address: [address] [place] [address]  Phone: [phone]     Emergency Department Providers:         Primary Physician:   Linda Murray Linda Murray would like to thank you for allowing us  to assist you with your healthcare needs. The following includes patient education materials and information regarding your injury/illness.     Follow-up Instructions:  You were seen today on an emergency basis. Please contact your primary care doctor for a follow up appointment. If you received a referral to a specialist doctor, it is important you follow-up as instructed.    It is important that you call your follow-up doctor to schedule and confirm the location of your next appointment. Your doctor may practice at multiple locations. The office location of your follow-up appointment may be different to the one written on your discharge instructions.    If you do not have a primary care doctor, please call [phone] for help in finding a Linda Murray. [facility] Provider. For help in finding a specialist doctor, please call [phone].    The [facility] "Ask a Nurse" line in staffed by Registered Nurses and is a free service to the community. We are available Monday - Friday from 8am to 5pm to answer your questions about your health. Please call [phone].    If your condition gets worse before your follow-up with your primary care doctor or specialist, please return to the  Emergency Department.        Coronavirus 2019 (COVID-19) Reminders:     Patients 70 and older can contact their Linda Murray. Linda Murray doctors' offices to schedule appointments to receive the COVID-19 vaccine at the [facility] or send us  an email at [email or web address].            Scan this code with your phone camera to send an email to the address above.      Patients who are 47 and older who do not have a Linda Linda Linda physician can call [phone] to schedule vaccination appointments.         Follow Up Appointments:  Primary Care Provider:      Name: Linda Murray      Phone: [phone]          Printed Prescriptions:    Patient Education Materials:  Discharge Orders       Comment:             Allergy Info: No Known Medication Allergies     Medication Information:  [facility] Physicians provided you with a complete list of medications post discharge, if you have been instructed to stop taking a medication please ensure you also  follow up with this information to your Primary Care Physician.  Unless otherwise noted, patient will continue to take medications as prescribed prior to the Emergency Room visit.  Any specific questions regarding your chronic medications and dosages should be discussed with your physician(s) and pharmacist.          calcium carbonate (Caltrate) Oral (given by mouth) every day.  calcium-vitamin D (Caltrate 600 + D oral tablet) 1 Tabs Oral (given by mouth) 2 times a day.  denosumab  (Prolia  60 mg/mL subcutaneous solution) 1 Milliliter Subcutaneous (under the skin) every 6 months.  nystatin  (nystatin  100,000 units/mL oral suspension)  omeprazole (PriLOSEC) 40 Milligram Oral (given by mouth) every day.  phenyTOIN (Dilantin 100 mg oral capsule, extended release)  phenyTOIN (Dilantin 100 mg oral capsule, extended release) one capule in am and two in pm.  sulfamethoxazole-trimethoprim (sulfamethoxazole-trimethoprim 800 mg-160 mg  oral tablet)      Medications Administered During Visit:              Medication Dose Route   Sodium Chloride 0.9% 1000 mL IV Piggyback   Dextrose 5% with 0.45% NaCl 1000 mL Initial Volume  250 mL/hr IV  Forearm, Mid Left          Major Tests and Procedures:  The following procedures and tests were performed during your ED visit.  COMMON PROCEDURES%>  COMMON PROCEDURES COMMENTS%>          Laboratory Orders  Name Status Details   Add On Completed Blood, Stat, ST - Stat, Collected, 08/11/19 8:50:00 EST, 08/11/19 8:50:00 EST, CHAG-MD,  MANOJ V, Print label Y/N, d-dimer, CRP, Draw Stat   Add On Completed Blood, Stat, ST - Stat, Collected, 08/11/19 8:51:00 EST, 08/11/19 8:51:00 EST, CHAG-MD,  MANOJ V, Print label Y/N, procalcitonin, Draw Stat   BMP Completed Blood, Stat, ST - Stat, 08/11/19 8:00:00 EST, 08/11/19 8:00:00 EST, Nurse collect, CHAG-MD,  MANOJ V, Print label Y/N   CBCDIFF Completed Blood, Stat, ST - Stat, 08/11/19 8:00:00 EST, 08/11/19 8:00:00 EST, Nurse collect, CHAG-MD,  MANOJ V, Print label Y/N   CRP Completed Blood, Stat, ST - Stat, Collected, 08/11/19 8:02:00 EST Linda Murray, 08/11/19 8:02:00 EST, Nurse collect, Venous Draw, 08/11/19 9:00:00 EST, SF CP Login, CHAG-MD,  MANOJ V, Print label Y/N, sf_accession_2, 4 mL SST PST/*A*/   D Dimer Completed Blood, Stat, ST - Stat, Collected, 08/11/19 8:02:00 EST Linda Murray, 08/11/19 8:02:00 EST, Nurse collect, Venous Draw, 08/11/19 8:11:00 EST, SF CP Login, CHAG-MD,  MANOJ V, Print label Y/N, sf_accession_2, 1.8 mL Blue/*C*/   Lactic Completed Blood, Stat, ST - Stat, 08/11/19 8:51:00 EST, 08/11/19 8:51:00 EST, Nurse collect, CHAG-MD,  MANOJ V, Print label Y/N   XTube Blue Completed Blood, Stat, ST - Stat, Collected, 08/11/19 8:02:00 EST MARTBR, 08/11/19 8:02:00 EST, Nurse collect, Venous Draw, 08/11/19 8:11:00 EST, SF CP Login, CHAG-MD,  MANOJ V, Print label Y/N, sf_lab_accession_3, sf_lab_accession_3, 1.8 mL Aolz/*[id number]*/,...               Radiology  Orders  Name Status Details   CT Angiography Chest w/ + w/o Contrast Completed 08/11/19 9:53:00 EST, STAT 1 hour or less, Reason: PE suspected, high pretest prob, Transport Mode: STRETCHER, Rad Type, pp_set_radiology_subspecialty   XR Chest 1 View Portable Completed 08/11/19 8:51:00 EST, STAT 1 hour or less, Reason: Shortness of breath  (SOB), Transport Mode: Portable, pp_set_radiology_subspecialty               Patient Care Orders  Name Status Details   Cardiac/NIBP/Pulse  Ox Monitoring Completed 08/11/19 7:44:00 EST, This message can only be seen by Nursing, it is not visible to Pharmacy, Laboratory, or Radiology., 08/11/19 7:44:00 EST, 08/11/19 7:44:00 EST, Once   Change attending to Ordered 08/11/19 12:04:00 EST, MALM-MD,  MATILDA S   Communication to Nursing Completed 08/11/19 7:34:08 EST, Move into view of RN station if possible, 08/11/19 7:34:08 EST   Communication to Nursing Completed 08/11/19 7:44:00 EST, Bamlanivimab - patient fact sheet and consent to bedside/chart., 08/11/19 7:44:00 EST, 08/11/19 7:44:00 EST   Communication to Nursing Completed 08/11/19 7:44:00 EST, Place patient in isolation room., 08/11/19 7:44:00 EST, 08/11/19 7:44:00 EST   Communication to Nursing Completed 08/11/19 7:44:00 EST, Notify provider for any signs/symptoms of transfusion reaction, 08/11/19 7:44:00 EST, 08/11/19 7:44:00 EST   ED Assessment Adult Completed 08/11/19 7:31:59 EST, 08/11/19 7:31:59 EST   ED Secondary Triage Completed 08/11/19 7:31:59 EST, 08/11/19 7:31:59 EST   ED Triage Adult Completed 08/11/19 7:18:09 EST, 08/11/19 7:18:09 EST   Fall Risk Precautions Ordered 08/11/19 7:34:08 EST, 08/11/19 7:34:08 EST   Patient Isolation Ordered 08/11/19 7:44:00 EST, Contact and Airborne, Constant Indicator   Patient Specific Fall Safety Measures Completed 08/11/19 13:00:00 EST, ED Mod/High Fall Risk Documentation   Patient Specific Fall Safety Measures Ordered 08/11/19 14:00:00 EST, ED Mod/High Fall Risk Documentation   Patient  Specific Fall Safety Measures Ordered 08/11/19 15:00:00 EST, ED Mod/High Fall Risk Documentation   Patient Specific Fall Safety Measures Ordered 08/11/19 16:00:00 EST, ED Mod/High Fall Risk Documentation   Patient Specific Fall Safety Measures Ordered 08/11/19 17:00:00 EST, ED Mod/High Fall Risk Documentation   Patient Specific Fall Safety Measures Ordered 08/11/19 18:00:00 EST, ED Mod/High Fall Risk Documentation   Patient Specific Fall Safety Measures Ordered 08/11/19 19:00:00 EST, ED Mod/High Fall Risk Documentation   Patient Specific Fall Safety Measures Ordered 08/11/19 20:00:00 EST, ED Mod/High Fall Risk Documentation   Patient Specific Fall Safety Measures Ordered 08/11/19 21:00:00 EST, ED Mod/High Fall Risk Documentation   Patient Specific Fall Safety Measures Ordered 08/11/19 22:00:00 EST, ED Mod/High Fall Risk Documentation   Patient Specific Fall Safety Measures Ordered 08/11/19 23:00:00 EST, ED Mod/High Fall Risk Documentation   Patient Specific Fall Safety Measures Completed 08/11/19 12:00:00 EST, ED Mod/High Fall Risk Documentation   Patient Specific Fall Safety Measures Completed 08/11/19 11:00:00 EST, ED Mod/High Fall Risk Documentation   Patient Specific Fall Safety Measures Completed 08/11/19 10:00:00 EST, ED Mod/High Fall Risk Documentation   Patient Specific Fall Safety Measures Completed 08/11/19 9:00:00 EST, ED Mod/High Fall Risk Documentation   Patient Specific Fall Safety Measures Completed 08/11/19 8:00:00 EST, ED Mod/High Fall Risk Documentation   Patient Specific Fall Safety Measures Ordered 08/12/19 0:00:00 EST, ED Mod/High Fall Risk Documentation   Patient Specific Fall Safety Measures Ordered 08/11/19 7:34:07 EST, q1hr, ED Mod/High Fall Risk Documentation   Patient Specific Fall Safety Measures Ordered 08/12/19 17:00:00 EST, ED Mod/High Fall Risk Documentation   Patient Specific Fall Safety Measures Ordered 08/12/19 16:00:00 EST, ED Mod/High Fall Risk Documentation   Patient Specific  Fall Safety Measures Ordered 08/12/19 1:00:00 EST, ED Mod/High Fall Risk Documentation   Patient Specific Fall Safety Measures Ordered 08/12/19 2:00:00 EST, ED Mod/High Fall Risk Documentation   Patient Specific Fall Safety Measures Ordered 08/12/19 3:00:00 EST, ED Mod/High Fall Risk Documentation   Patient Specific Fall Safety Measures Ordered 08/12/19 4:00:00 EST, ED Mod/High Fall Risk Documentation   Patient Specific Fall Safety Measures Ordered 08/12/19 5:00:00 EST, ED Mod/High Fall Risk Documentation   Patient Specific Fall Safety  Measures Ordered 08/12/19 6:00:00 EST, ED Mod/High Fall Risk Documentation   Patient Specific Fall Safety Measures Ordered 08/12/19 7:00:00 EST, ED Mod/High Fall Risk Documentation   Patient Specific Fall Safety Measures Ordered 08/12/19 8:00:00 EST, ED Mod/High Fall Risk Documentation   Patient Specific Fall Safety Measures Ordered 08/12/19 9:00:00 EST, ED Mod/High Fall Risk Documentation   Patient Specific Fall Safety Measures Ordered 08/12/19 10:00:00 EST, ED Mod/High Fall Risk Documentation   Patient Specific Fall Safety Measures Ordered 08/12/19 11:00:00 EST, ED Mod/High Fall Risk Documentation   Patient Specific Fall Safety Measures Ordered 08/12/19 12:00:00 EST, ED Mod/High Fall Risk Documentation   Patient Specific Fall Safety Measures Ordered 08/12/19 13:00:00 EST, ED Mod/High Fall Risk Documentation   Patient Specific Fall Safety Measures Ordered 08/12/19 14:00:00 EST, ED Mod/High Fall Risk Documentation   Patient Specific Fall Safety Measures Ordered 08/12/19 15:00:00 EST, ED Mod/High Fall Risk Documentation   Patient Specific Fall Safety Measures Completed 08/11/19 7:34:08 EST, Once, 08/11/19 7:34:08 EST, ED Mod/High Fall Risk Documentation   Saline Lock Insert Completed 08/11/19 7:44:00 EST, Once, 08/11/19 7:44:00 EST   Transport Patient Ordered [facility], 08/11/19 12:04:00 EST   Vital Signs Ordered 08/11/19 7:44:00 EST, q68min, PRN, During and 1-hr  post infusion       ---------------------------------------------------------------------------------------------------------------------  [facility]) encourages you to self-enroll in the [facility] Patient Portal.  [facility] Patient Portal will allow you to manage your personal health information securely from your own electronic device now and in the future.  To begin your Patient Portal enrollment process, please visit [email or web address] Click on "Sign up now" under [facility].  If you find that you need additional assistance on the [facility] Patient Portal or need a copy of your medical records, please call the [facility] Medical Records Office at [phone].  Comment:

## 2019-08-11 NOTE — Nursing Note (Signed)
Adult Patient History Form-Text       Adult Patient History Entered On:  08/11/2019 16:06 EST    Performed On:  08/11/2019 16:05 EST by Arleta Creek, RN, Jessica               General Info   Patient Identified :   Identification band   Patient Identified :   Linda Murray   Information Given By :   Self   Preferred Mode of Communication :   Verbal   Accompanied By :   None   Pregnancy Status :   N/A   Has the patient received chemotherapy or immunotherapy (cytotoxic)  in the last 48-72 hours? :   No   Is the patient currently (2-3 days) receiving radiation treatment? :   No   Arleta Creek, RN, Shanda Bumps - 08/11/2019 16:05 EST   Allergies   (As Of: 08/11/2019 16:06:17 EST)   Allergies (Active)   No Known Medication Allergies  Estimated Onset Date:   Unspecified ; Created ByHomero Fellers, RN, AMANDA L; Reaction Status:   Active ; Category:   Drug ; Substance:   No Known Medication Allergies ; Type:   Allergy ; Updated By:   Homero Fellers RN, Jill Side; Reviewed Date:   08/11/2019 15:22 EST        Admission Complete   Admission Complete :   Yes   Amparo Bristol - 08/11/2019 16:05 EST

## 2019-08-11 NOTE — ED Notes (Signed)
ED Triage Note       ED Secondary Triage Entered On:  08/11/2019 7:34 EST    Performed On:  08/11/2019 7:33 EST by Zadie Rhine, RN, KRISTY M               General Information   Barriers to Learning :   None evident   ED Home Meds Section :   Document assessment   [facility] ED Fall Risk Section :   Document assessment   ED History Section :   Document assessment   ED Advance Directives Section :   Document assessment   ED Palliative Screen :   Document assessment   BELLEW, RN, KRISTY M - 08/11/2019 7:33 EST   (As Of: 08/11/2019 07:34:07 EST)   Problems(Active)    Aphasia (IMO  :322025 )  Name of Problem:   Aphasia ; Recorder:   CABE, RN, ADAM Z; Confirmation:   Confirmed ; Classification:   Medical ; Code:   [id number] ; Contributor System:   Dietitian ; Last Updated:   01/22/2016 18:29 EDT ; Life Cycle Date:   01/22/2016 ; Life Cycle Status:   Active ; Vocabulary:   IMO        Apraxia (IMO  :G166641 )  Name of Problem:   Apraxia ; Recorder:   CABE, RN, ADAM Z; Confirmation:   Confirmed ; Classification:   Medical ; Code:   [id number] ; Contributor System:   Dietitian ; Last Updated:   01/22/2016 18:29 EDT ; Life Cycle Date:   01/22/2016 ; Life Cycle Status:   Active ; Vocabulary:   IMO        CVA (cerebral vascular accident) (IMO  :[id number] )  Name of Problem:   CVA (cerebral vascular accident) ; Recorder:   Homero Fellers, RN, AMANDA L; Confirmation:   Confirmed ; Classification:   Medical ; Code:   [id number] ; Contributor System:   Dietitian ; Last Updated:   07/20/2015 19:30 EST ; Life Cycle Date:   07/20/2015 ; Life Cycle Status:   Active ; Vocabulary:   IMO        Hx of seizure disorder (SNOMED CT  :151761607 )  Name of Problem:   Hx of seizure disorder ; Recorder:   DORKEWITZ, RN, BRIDGET A; Confirmation:   Confirmed ; Classification:   Patient Stated ; Code:   [id number] ; Contributor System:   PowerChart ; Last Updated:   10/22/2017 11:25 EDT ; Life Cycle Date:   10/22/2017 ; Life Cycle Status:   Active ; Vocabulary:   SNOMED CT        Hyperlipidemia  (SNOMED CT  :85462703 )  Name of Problem:   Hyperlipidemia ; Recorder:   DORKEWITZ, RN, BRIDGET A; Confirmation:   Confirmed ; Classification:   Patient Stated ; Code:   [id number] ; Contributor System:   PowerChart ; Last Updated:   10/22/2017 11:24 EDT ; Life Cycle Date:   10/22/2017 ; Life Cycle Status:   Active ; Vocabulary:   SNOMED CT        UI (urinary incontinence) (SNOMED CT  :2993716967 )  Name of Problem:   UI (urinary incontinence) ; Recorder:   DORKEWITZ, RN, BRIDGET A; Confirmation:   Confirmed ; Classification:   Patient Stated ; Code:   [id number] ; Contributor System:   PowerChart ; Last Updated:   10/22/2017 11:25 EDT ; Life Cycle Date:   10/22/2017 ; Life Cycle Status:   Active ; Vocabulary:   SNOMED CT  Diagnoses(Active)    Medication administration  Date:   08/11/2019 ; Diagnosis Type:   Reason For Visit ; Confirmation:   Complaint of ; Clinical Dx:   Medication administration ; Classification:   Medical ; Clinical Service:   Emergency medicine ; Code:   PNED ; Probability:   0 ; Diagnosis Code:   [id number] AD             -    Procedure History   (As Of: 08/11/2019 07:34:07 EST)     Procedure Dt/Tm:   01/22/2016 21:00:00 EDT ; Location:   [facility] ; Provider:   Laren Boom; Anesthesia Type:   Monitored Anesthesia Care ; :   Helyn Numbers,  JEFFREY DEAN; Anesthesia Minutes:   0 ; Procedure Name:   Esophagastroduodenoscopy with Removal Foreign Body ; Procedure Minutes:   6 ; Comments:     01/22/2016 21:27 EDT - Urbano Heir, RN, DONNA J  auto-populated from documented surgical case ; Clinical Service:   Surgery            Anesthesia Minutes:   0 ; Procedure Name:   Brain ; Procedure Minutes:   0 ; Comments:     10/22/2017 11:26 EDT - Butch Penny, RN, BRIDGET A  surgery 04/1984            [facility] Fall Risk Assessment Tool   Hx of falling last 3 months ED Fall :   No   Patient confused or disoriented ED Fall :   No   Patient intoxicated or sedated ED Fall :   No   Patient  impaired gait ED Fall :   Yes   Use a mobility assistance device ED Fall :   Yes   Patient altered elimination ED Fall :   Salome Holmes ED Fall Score :   3    BELLEW, RN, Soyla Dryer - 08/11/2019 7:33 EST   ED Advance Directive   Advance Directive :   Yes   BELLEW, RN, KRISTY M - 08/11/2019 7:33 EST   Palliative Care   Does the Patient have a Life Limiting Illness :   None of the above   BELLEW, RN, KRISTY M - 08/11/2019 7:33 EST   Social History   Social History   (As Of: 08/11/2019 07:34:07 EST)   Tobacco:        Former smoker, Cigarettes   (Last Updated: 01/22/2016 18:29:32 EDT by Loura Pardon, RN, ADAM Z)          Alcohol:        Denies   (Last Updated: 10/22/2017 11:28:05 EDT by Butch Penny, RN, BRIDGET A)

## 2019-08-11 NOTE — Nursing Note (Signed)
Adult Patient History Form-Text       Adult Patient History Entered On:  08/11/2019 16:05 EST    Performed On:  08/11/2019 16:05 EST by Arleta Creek, RN, Jessica               General Info   Patient Identified :   Linda Murray   Information Given By :   Self   Preferred Mode of Communication :   Verbal   Pregnancy Status :   N/A   Arleta Creek RN, Jessica - 08/11/2019 16:05 EST   Admission Complete   Admission Complete :   Yes   Amparo Bristol - 08/11/2019 16:05 EST

## 2019-08-11 NOTE — Nursing Note (Signed)
Adult Patient History Form-Text       Adult Patient History Entered On:  08/11/2019 15:30 EST    Performed On:  08/11/2019 15:21 EST by Arleta CreekNieves, RN, Jessica               General Info   Patient Identified :   Identification band   Patient Identified :   Linda Murray   Information Given By :   Self   Preferred Mode of Communication :   Verbal   Accompanied By :   None   Pregnancy Status :   N/A   Has the patient received chemotherapy or immunotherapy (cytotoxic)  in the last 48-72 hours? :   No   Is the patient currently (2-3 days) receiving radiation treatment? :   No   Arleta CreekNieves, RN, Shanda BumpsJessica - 08/11/2019 15:21 EST   Allergies   (As Of: 08/11/2019 15:30:03 EST)   Allergies (Active)   No Known Medication Allergies  Estimated Onset Date:   Unspecified ; Created ByHomero Fellers:   FRANK, RN, AMANDA L; Reaction Status:   Active ; Category:   Drug ; Substance:   No Known Medication Allergies ; Type:   Allergy ; Updated By:   Homero FellersFRANK, RN, Jill SideAMANDA L; Reviewed Date:   08/11/2019 15:22 EST        Medication History   Medication List   (As Of: 08/11/2019 15:30:03 EST)   Normal Order    Dextrose 5% with 0.45% NaCl intravenous solution 1,000 mL  :   Dextrose 5% with 0.45% NaCl intravenous solution 1,000 mL ; Status:   Ordered ; Ordered As Mnemonic:   D5-0.45NaCl 1,000 mL ; Simple Display Line:   250 mL/hr, IV, Stop: 08/11/19 19:52:00 EST ; Ordering Provider:   Soyla DryerHAG-MD,  MANOJ V; Catalog Code:   Dextrose 5% with 0.45% NaCl ; Order Dt/Tm:   08/11/2019 11:53:22 EST          iopamidol 76% Inj Soln 100 mL  :   iopamidol 76% Inj Soln 100 mL ; Status:   Ordered ; Ordered As Mnemonic:   Isovue-370 ; Simple Display Line:   100 mL, IV Contrast, Once ; Ordering Provider:   CHAG-MD,  Gerre ScullMANOJ V; Catalog Code:   iopamidol ; Order Dt/Tm:   08/11/2019 09:53:41 EST          acetaminophen 500 mg Tab  :   acetaminophen 500 mg Tab ; Status:   Voided ; Ordered As Mnemonic:   acetaminophen ; Simple Display Line:   1,000 mg, 2 tabs, Oral, Once, PRN: other (see comment) ; Ordering  Provider:   Soyla DryerHAG-MD,  MANOJ V; Catalog Code:   acetaminophen ; Order Dt/Tm:   08/11/2019 07:44:06 EST ; Comment:   PRN for headache or fever          bamlanivimab  :   bamlanivimab ; Status:   Discontinued ; Ordered As Mnemonic:   bamlanivimab IVPB ; Simple Display Line:   700 mg, 20 mL, 270 mL/hr, IV Piggyback, Once ; Ordering Provider:   CHAG-MD,  Gerre ScullMANOJ V; Catalog Code:   bamlanivimab ; Order Dt/Tm:   08/11/2019 07:44:06 EST ; Comment:   MUST INFUSE using 0.20/0.22 micron in-line filter.  Flush tubing with 50ml 0.9% NS after infusion complete.          diphenhydrAMINE 50 mg/mL Inj Soln 1 mL  :   diphenhydrAMINE 50 mg/mL Inj Soln 1 mL ; Status:   Discontinued ; Ordered As Mnemonic:   diphenhydrAMINE ; Simple Display  Line:   25 mg, 0.5 mL, IV Push, Once, PRN: itching/allergic reaction ; Ordering Provider:   CHAG-MD,  Gerre ScullMANOJ V; Catalog Code:   diphenhydrAMINE ; Order Dt/Tm:   08/11/2019 07:44:06 EST          ondansetron 2 mg/mL Inj Soln 2 mL  :   ondansetron 2 mg/mL Inj Soln 2 mL ; Status:   Discontinued ; Ordered As Mnemonic:   ondansetron ; Simple Display Line:   4 mg, 2 mL, IV Push, Once, PRN: nausea/vomiting ; Ordering Provider:   Soyla DryerHAG-MD,  MANOJ V; Catalog Code:   ondansetron ; Order Dt/Tm:   08/11/2019 07:44:06 EST          Sodium Chloride 0.9% intravenous solution Bolus  :   Sodium Chloride 0.9% intravenous solution Bolus ; Status:   Completed ; Ordered As Mnemonic:   Sodium Chloride 0.9% bolus ; Simple Display Line:   1,000 mL, 1000 mL/hr, IV Piggyback, Once ; Ordering Provider:   CHAG-MD,  Hardie ShackletonMANOJ V; Catalog Code:   Sodium Chloride 0.9% ; Order Dt/Tm:   08/11/2019 07:43:57 EST          denosumab  :   denosumab ; Status:   Future ; Ordered As Mnemonic:   denosumab ; Simple Display Line:   60 mg, Subcutaneous, Day of Tx, Day 1 ; Ordering Provider:   Pasty SpillersFrizelle, MD, Philmore PaliKatherine S; Catalog Code:   denosumab ; Order Dt/Tm:   04/22/2018 08:37:22 EDT ; Comment:   Outpatient use only          denosumab  :   denosumab ; Status:    Future ; Ordered As Mnemonic:   denosumab ; Simple Display Line:   60 mg, Subcutaneous, Day of Tx, Day 1 ; Ordering Provider:   Pasty SpillersFrizelle, MD, Philmore PaliKatherine S; Catalog Code:   denosumab ; Order Dt/Tm:   04/22/2018 08:37:22 EDT ; Comment:   Outpatient use only          Zero Time Placeholder  :   Zero Time Placeholder ; Status:   Future ; Ordered As Mnemonic:   Zero Time Placeholder ; Simple Display Line:   1 mL, Day of Tx, Day 1 ; Ordering Provider:   Pasty SpillersFrizelle, MD, Philmore PaliKatherine S; Catalog Code:   Zero Time Placeholder ; Order Dt/Tm:   04/22/2018 08:36:59 EDT          Zero Time Placeholder  :   Zero Time Placeholder ; Status:   Future ; Ordered As Mnemonic:   Zero Time Placeholder ; Simple Display Line:   1 mL, Day of Tx, Day 1 ; Ordering Provider:   Pasty SpillersFrizelle, MD, Philmore PaliKatherine S; Catalog Code:   Zero Time Placeholder ; Order Dt/Tm:   04/22/2018 08:36:59 EDT            Home Meds    nystatin  :   nystatin ; Status:   Documented ; Ordered As Mnemonic:   nystatin 100,000 units/mL oral suspension ; Simple Display Line:   0 Refill(s) ; Catalog Code:   nystatin ; Order Dt/Tm:   08/11/2019 07:31:53 EST          phenyTOIN  :   phenyTOIN ; Status:   Documented ; Ordered As Mnemonic:   Dilantin 100 mg oral capsule, extended release ; Simple Display Line:   0 Refill(s) ; Catalog Code:   phenyTOIN ; Order Dt/Tm:   08/11/2019 07:31:53 EST          sulfamethoxazole-trimethoprim  :   sulfamethoxazole-trimethoprim ;  Status:   Documented ; Ordered As Mnemonic:   sulfamethoxazole-trimethoprim 800 mg-160 mg oral tablet ; Simple Display Line:   0 Refill(s) ; Catalog Code:   sulfamethoxazole-trimethoprim ; Order Dt/Tm:   08/11/2019 07:31:53 EST          calcium-vitamin D  :   calcium-vitamin D ; Status:   Documented ; Ordered As Mnemonic:   Caltrate 600 + D oral tablet ; Simple Display Line:   1 tabs, Oral, BID, 60 tabs, 0 Refill(s) ; Catalog Code:   calcium-vitamin D ; Order Dt/Tm:   10/22/2017 11:51:11 EDT          omeprazole  :   omeprazole ; Status:    Documented ; Ordered As Mnemonic:   PriLOSEC ; Simple Display Line:   40 mg, Oral, Daily, 0 Refill(s) ; Catalog Code:   omeprazole ; Order Dt/Tm:   10/22/2017 11:50:56 EDT          phenyTOIN  :   phenyTOIN ; Status:   Documented ; Ordered As Mnemonic:   Dilantin 100 mg oral capsule, extended release ; Simple Display Line:   See Instructions, one capule in am and two in pm ; Ordering Provider:   Pasty Spillers, MD, Philmore Pali; Catalog Code:   phenyTOIN ; Order Dt/Tm:   10/13/2015 13:55:06 EDT          calcium carbonate  :   calcium carbonate ; Status:   Documented ; Ordered As Mnemonic:   Caltrate ; Simple Display Line:   mg, Oral, Daily, 0 Refill(s) ; Catalog Code:   calcium carbonate ; Order Dt/Tm:   10/13/2015 13:54:57 EDT          denosumab  :   denosumab ; Status:   Documented ; Ordered As Mnemonic:   Prolia 60 mg/mL subcutaneous solution ; Simple Display Line:   60 mg, 1 mL, Subcutaneous, q70mo, 1 mL, 0 Refill(s) ; Catalog Code:   denosumab ; Order Dt/Tm:   10/13/2015 13:54:44 EDT            Problem History   (As Of: 08/11/2019 15:30:03 EST)   Problems(Active)    Aphasia (IMO  :937902 )  Name of Problem:   Aphasia ; Recorder:   CABE, RN, ADAM Z; Confirmation:   Confirmed ; Classification:   Medical ; Code:   [id number] ; Contributor System:   Dietitian ; Last Updated:   01/22/2016 18:29 EDT ; Life Cycle Date:   01/22/2016 ; Life Cycle Status:   Active ; Vocabulary:   IMO        Apraxia (IMO  :[id number] )  Name of Problem:   Apraxia ; Recorder:   CABE, RN, ADAM Z; Confirmation:   Confirmed ; Classification:   Medical ; Code:   [id number] ; Contributor System:   Dietitian ; Last Updated:   01/22/2016 18:29 EDT ; Life Cycle Date:   01/22/2016 ; Life Cycle Status:   Active ; Vocabulary:   IMO        CVA (cerebral vascular accident) (IMO  :[id number] )  Name of Problem:   CVA (cerebral vascular accident) ; Recorder:   Homero Fellers, RN, AMANDA L; Confirmation:   Confirmed ; Classification:   Medical ; Code:   [phone] ; Contributor System:   Dietitian ;  Last Updated:   07/20/2015 19:30 EST ; Life Cycle Date:   07/20/2015 ; Life Cycle Status:   Active ; Vocabulary:   IMO        Hx of seizure  disorder (SNOMED CT  :176160737 )  Name of Problem:   Hx of seizure disorder ; Recorder:   DORKEWITZ, RN, BRIDGET A; Confirmation:   Confirmed ; Classification:   Patient Stated ; Code:   [id number] ; Contributor System:   PowerChart ; Last Updated:   10/22/2017 11:25 EDT ; Life Cycle Date:   10/22/2017 ; Life Cycle Status:   Active ; Vocabulary:   SNOMED CT        Hyperlipidemia (SNOMED CT  :46270350 )  Name of Problem:   Hyperlipidemia ; Recorder:   DORKEWITZ, RN, BRIDGET A; Confirmation:   Confirmed ; Classification:   Patient Stated ; Code:   [id number] ; Contributor System:   PowerChart ; Last Updated:   10/22/2017 11:24 EDT ; Life Cycle Date:   10/22/2017 ; Life Cycle Status:   Active ; Vocabulary:   SNOMED CT        Shortness of breath (IMO  :93716 )  Name of Problem:   Shortness of breath ; Recorder:   SYSTEM,  SYSTEM; Confirmation:   Confirmed ; Classification:   Patient Stated ; Code:   96789 ; Last Updated:   08/11/2019 11:54 EST ; Life Cycle Date:   08/11/2019 ; Life Cycle Status:   Active ; Vocabulary:   IMO        UI (urinary incontinence) (SNOMED CT  :3810175102 )  Name of Problem:   UI (urinary incontinence) ; Recorder:   DORKEWITZ, RN, BRIDGET A; Confirmation:   Confirmed ; Classification:   Patient Stated ; Code:   [id number] ; Contributor System:   PowerChart ; Last Updated:   10/22/2017 11:25 EDT ; Life Cycle Date:   10/22/2017 ; Life Cycle Status:   Active ; Vocabulary:   SNOMED CT          Procedure History        -    Procedure History   (As Of: 08/11/2019 15:30:03 EST)     Procedure Dt/Tm:   01/22/2016 21:00:00 EDT ; Location:   [facility] ; Provider:   Ricke Hey; Anesthesia Type:   Monitored Anesthesia Care ; :   BURNETTE-MD,  JEFFREY DEAN; Anesthesia Minutes:   0 ; Procedure Name:   Esophagastroduodenoscopy with Removal Foreign Body ; Procedure Minutes:    6 ; Comments:     01/22/2016 21:27 EDT - Corrie Mckusick, RN, DONNA J  auto-populated from documented surgical case ; Clinical Service:   Surgery ; Last Reviewed Dt/Tm:   08/11/2019 15:23:25 EST            Anesthesia Minutes:   0 ; Procedure Name:   Brain ; Procedure Minutes:   0 ; Comments:     10/22/2017 11:26 EDT - Ocie Cornfield, RN, Ali Chukson A  surgery [birth year]1984 ; Last Reviewed Dt/Tm:   08/11/2019 15:23:25 EST            Immunizations   Influenza Vaccine Status :   Received prior to admission, during current flu season   Influenza Vaccine Status :   Felicity Pellegrini, RN, Janett Billow - 08/11/2019 15:21 EST   ID Risk Screen Symptoms   Recent Travel History :   No recent travel   Close Contact with COVID-19 ID :   No   Last 14 days COVID-19 ID :   Yes - Detected (positive)   TB Symptom Screen :   No symptoms   C. diff Symptom/History ID :   Neither of the above   Patient Pregnant :  None of the above   MRSA/VRE Screening :   None of these apply   CRE Screening :   No   Amparo Bristol - 08/11/2019 15:21 EST   Bloodless Medicine   Is Blood Transfusion Acceptable to Patient :   Yes   Arleta Creek, RN, Shanda Bumps - 08/11/2019 15:21 EST   Nutrition   MST Does Your Current Diet Include :   None   MST Have You Recently Lost Weight Without Trying? :   No   MST Weight Loss Score :   0    MST Have You Been Eating Poorly? :   No   MST Appetite Score :   0    MST Score :   0    MST Interpretation :   Not at risk   Amparo Bristol - 08/11/2019 15:21 EST   Functional   Sensory Deficits :   Speech deficit   ADLs Prior to Admission :   Minimal assistance   [place], [place] - 08/11/2019 15:21 EST   Social History   Social History   (As Of: 08/11/2019 15:30:03 EST)   Tobacco:        Former smoker, Cigarettes   (Last Updated: 01/22/2016 18:29:32 EDT by Loura Pardon, RN, ADAM Z)          Alcohol:        Denies   (Last Updated: 10/22/2017 11:28:05 EDT by Butch Penny, RN, BRIDGET A)            Spiritual   Faith/Denomination :   None specified   Do you have a concern that you  would like to address with a Chaplain? :   No   Do you have any religious/spiritual/cultural beliefs that could impact the way your care is provided? :   No   Arleta Creek, RN, Shanda Bumps - 08/11/2019 15:21 EST   Harm Screen   Injuries/Abuse/Neglect in Household :   Denies   Feels Unsafe at Home :   No   Last 3 mo, thoughts killing self/others :   Patient denies   Amparo Bristol - 08/11/2019 15:21 EST   Advance Directive   Advance Directive :   Cynda [place], RNShanda Bumps - 08/11/2019 15:21 EST   Education   Written Language :   [place]   Caregiver/Advocate Primary Language :   [place]   Caregiver/Advocate Written Language :   [place]   Primary Language :   Leonard Schwartz, RN, Jessica - 08/11/2019 15:21 EST   Caregiver/Advocate Language   Patient :   None   Family :   None   Arleta Creek RN, Shanda Bumps - 08/11/2019 15:21 EST   Barriers to Learning :   Acuity of Illness   Teaching Method :   Demonstration, Explanation   Arleta Creek, RN, Jessica - 08/11/2019 15:21 EST   DC Needs   CM Living Situation :   Family support   Amparo Bristol - 08/11/2019 15:21 EST   Valuables and Belongings   Does Patient Have Valuables and Belongings :   Yes   Arleta Creek, RN, Shanda Bumps - 08/11/2019 15:21 EST   Valuables and Belongings   At Bedside :   Clothes, Jewelry, pants, shirt, undergarments, watch   Amparo Bristol - 08/11/2019 15:21 EST   Patient Search Completed :   Leane Platt, RN, Shanda Bumps - 08/11/2019 15:21 EST   Admission Complete   Admission Complete :   Yes   Arleta Creek, RN, Shanda Bumps -  08/11/2019 15:21 EST

## 2019-08-12 LAB — CBC WITH AUTO DIFFERENTIAL
Absolute Baso #: 0 10*3/uL (ref 0.0–0.2)
Absolute Eos #: 0 10*3/uL (ref 0.0–0.5)
Absolute Lymph #: 0.5 10*3/uL — ABNORMAL LOW (ref 1.0–3.2)
Absolute Mono #: 0.2 10*3/uL — ABNORMAL LOW (ref 0.3–1.0)
Basophils %: 0.4 % (ref 0.0–2.0)
Eosinophils %: 0 % (ref 0.0–7.0)
Hematocrit: 37.6 % (ref 34.0–47.0)
Hemoglobin: 12.6 g/dL (ref 11.5–15.7)
Immature Grans (Abs): 0.02 10*3/uL (ref 0.00–0.06)
Immature Granulocytes: 0.4 % (ref 0.1–0.6)
Lymphocytes: 11.3 % — ABNORMAL LOW (ref 15.0–45.0)
MCH: 34.3 pg (ref 27.0–34.5)
MCHC: 33.5 g/dL (ref 32.0–36.0)
MCV: 102.5 fL — ABNORMAL HIGH (ref 81.0–99.0)
MPV: 10.6 fL (ref 7.2–13.2)
Monocytes: 4 % (ref 4.0–12.0)
Neutrophils %: 83.9 % — ABNORMAL HIGH (ref 42.0–74.0)
Neutrophils Absolute: 3.8 10*3/uL (ref 1.6–7.3)
Platelets: 127 10*3/uL — ABNORMAL LOW (ref 140–440)
RBC: 3.67 x10e6/mcL (ref 3.60–5.20)
RDW: 13.7 % (ref 11.0–16.0)
WBC: 4.5 10*3/uL (ref 3.8–10.6)

## 2019-08-12 LAB — COMPREHENSIVE METABOLIC PANEL
ALT: 36 U/L — ABNORMAL HIGH (ref 0–33)
AST: 49 U/L — ABNORMAL HIGH (ref 0–32)
Albumin/Globulin Ratio: 1.05 mmol/L (ref 1.00–2.00)
Albumin: 2.9 g/dL — ABNORMAL LOW (ref 3.5–5.2)
Alk Phosphatase: 56 U/L (ref 35–117)
Anion Gap: 10 mmol/L (ref 2–17)
BUN: 25 mg/dL — ABNORMAL HIGH (ref 8–23)
CO2: 15 mmol/L — ABNORMAL LOW (ref 22–29)
Calcium: 7.4 mg/dL — ABNORMAL LOW (ref 8.8–10.2)
Chloride: 120 mmol/L — ABNORMAL HIGH (ref 98–107)
Creatinine: 0.9 mg/dL (ref 0.5–1.0)
GFR African American: 71 mL/min/{1.73_m2} — ABNORMAL LOW (ref >=90)
GFR Non-African American: 61 mL/min/{1.73_m2} — ABNORMAL LOW (ref >=90)
Globulin: 2.7 g/dL (ref 1.9–4.4)
Glucose: 115 mg/dL — ABNORMAL HIGH (ref 70–99)
OSMOLALITY CALCULATED: 296 mOsm/kg — ABNORMAL HIGH (ref 270–287)
Potassium: 4.4 mmol/L (ref 3.5–5.3)
Sodium: 147 mmol/L — ABNORMAL HIGH (ref 135–145)
Total Bilirubin: 0.44 mg/dL (ref 0.00–1.20)
Total Protein: 5.6 g/dL — ABNORMAL LOW (ref 6.4–8.3)

## 2019-08-12 LAB — PROCALCITONIN: Procalcitonin: 0.14 ng/mL (ref <=0.24)

## 2019-08-12 LAB — C-REACTIVE PROTEIN: CRP: 9.19 mg/dL — ABNORMAL HIGH (ref 0.00–0.50)

## 2019-08-12 LAB — D-DIMER, QUANTITATIVE: D-Dimer, Quant: 1.65 mcg/mL FEU — ABNORMAL HIGH (ref 0.19–0.51)

## 2019-08-12 NOTE — Progress Notes (Signed)
 Inpatient PT Examination - Text       Inpatient PT Examination Entered On:  08/12/2019 14:19 EST    Performed On:  08/12/2019 13:54 EST by Keneth, PT, Tori L               Reason for Treatment   Subjective Statement :   Pt and RN agreeable to PT eval. Pt was found supine in bed. She reprots no pain.      *Reason for Referral :   COVID +   ***Pt answers only yes or no questions    PMH: brain aneurysm c R sided hemi-paresis, aphasia, apraxia     *Chief Complaint :   Weakness     Keneth, PT, Tori L - 08/12/2019 13:54 EST   General Info   Physical Therapy Orders :   Physical Therapy Evaluation and Treatment Acute - 08/11/19 18:17:00 EST, Stop date 08/11/19 18:17:00 EST, A consult cannot be completed if there is a bedrest activity order; check for bedrest order.     Precautions RTF :    Low Risk HD Precaution, 08/11/19 21:09:47 EST, Constant Order, Ordered   Communication to Nursing, 08/11/19 15:27:00 EST, Discontinue the following IV, SC or PO anticoagulants (except heparin flushes): heparin, dalteparin, bivalirudin, argatroban, fondaparinux, dabigatran, rivaroxaban, apixiban, edoxaban, 08/11/19 15:27:00 EST, 08/11/19 15:27:00 EST, Ordered   Communication to Nursing, 08/11/19 15:27:00 EST, Exclusion Criteria: Platelets less than 50k, fibrinogen less than 100, active bleeding, and/or other contraindication, 08/11/19 15:27:00 EST, 08/11/19 15:27:00 EST, Ordered   Hospitalist Team, 08/11/19 15:27:00 EST, MP Team B, Constant Indicator, Ordered   Notify Provider, 08/11/19 15:27:00 EST, bleeding or black, tarry stool, 08/11/19 15:27:00 EST, 08/11/19 15:27:00 EST, Ordered   Notify Rapid Response Team, 08/11/19 15:27:00 EST, For concerns regarding patient condition & notify MD, 08/11/19 15:27:00 EST, 08/11/19 15:27:00 EST, Ordered   Patient Isolation, 08/11/19 15:27:00 EST, Contact and Airborne, Ordered   COVID-19 Status, 08/11/19 15:06:57 EST, NOT VALID FOR pharmacy, laboratory, radiology., 08/11/19 15:06:57 EST, COVID-19 Detected,  Discontinued     Affect/Behavior :   Appropriate, Cooperative   Basic Command Following :   Intact   Safety/Judgment :   Intact   Pain Present :   Yes actual or suspected pain   Keneth, PT, Tori L - 08/12/2019 13:54 EST   Problem List   (As Of: 08/12/2019 14:19:06 EST)   Problems(Active)    Aphasia (IMO  :806216 )  Name of Problem:   Aphasia ; Recorder:   CABE, RN, ADAM Z; Confirmation:   Confirmed ; Classification:   Medical ; Code:   [id number] ; Contributor System:   PowerChart ; Last Updated:   01/22/2016 18:29 EDT ; Life Cycle Date:   01/22/2016 ; Life Cycle Status:   Active ; Vocabulary:   IMO        Apraxia (IMO  :I600623 )  Name of Problem:   Apraxia ; Recorder:   CABE, RN, ADAM Z; Confirmation:   Confirmed ; Classification:   Medical ; Code:   [id number] ; Contributor System:   PowerChart ; Last Updated:   01/22/2016 18:29 EDT ; Life Cycle Date:   01/22/2016 ; Life Cycle Status:   Active ; Vocabulary:   IMO        CVA (cerebral vascular accident) (IMO  :[id number] )  Name of Problem:   CVA (cerebral vascular accident) ; Recorder:   DEMPSEY, RN, AMANDA L; Confirmation:   Confirmed ; Classification:   Medical ; Code:   [phone] ;  Contributor System:   PowerChart ; Last Updated:   07/20/2015 19:30 EST ; Life Cycle Date:   07/20/2015 ; Life Cycle Status:   Active ; Vocabulary:   IMO        Hx of seizure disorder (SNOMED CT  :588466980 )  Name of Problem:   Hx of seizure disorder ; Recorder:   DORKEWITZ, RN, BRIDGET A; Confirmation:   Confirmed ; Classification:   Patient Stated ; Code:   [id number] ; Contributor System:   PowerChart ; Last Updated:   10/22/2017 11:25 EDT ; Life Cycle Date:   10/22/2017 ; Life Cycle Status:   Active ; Vocabulary:   SNOMED CT        Hyperlipidemia (SNOMED CT  :07173982 )  Name of Problem:   Hyperlipidemia ; Recorder:   DORKEWITZ, RN, BRIDGET A; Confirmation:   Confirmed ; Classification:   Patient Stated ; Code:   07173982 ; Contributor System:   PowerChart ; Last Updated:   10/22/2017 11:24 EDT ; Life Cycle Date:    10/22/2017 ; Life Cycle Status:   Active ; Vocabulary:   SNOMED CT        Shortness of breath (IMO  :72576 )  Name of Problem:   Shortness of breath ; Recorder:   SYSTEM,  SYSTEM; Confirmation:   Confirmed ; Classification:   Patient Stated ; Code:   72576 ; Last Updated:   08/11/2019 11:54 EST ; Life Cycle Date:   08/11/2019 ; Life Cycle Status:   Active ; Vocabulary:   IMO        UI (urinary incontinence) (SNOMED CT  :8510263985 )  Name of Problem:   UI (urinary incontinence) ; Recorder:   DORKEWITZ, RN, BRIDGET A; Confirmation:   Confirmed ; Classification:   Patient Stated ; Code:   [id number] ; Contributor System:   PowerChart ; Last Updated:   10/22/2017 11:25 EDT ; Life Cycle Date:   10/22/2017 ; Life Cycle Status:   Active ; Vocabulary:   SNOMED CT          Diagnoses(Active)    Dysphagia, unspecified  Date:   08/12/2019 ; Diagnosis Type:   Reason For Visit ; Confirmation:   Differential ; Clinical Dx:   Dysphagia, unspecified ; Classification:   Interdisciplinary ; Clinical Service:   Non-Specified ; Code:   ICD-10-CM ; Probability:   0 ; Diagnosis Code:   R13.10      Other abnormalities of gait and mobility  Date:   08/12/2019 ; Diagnosis Type:   Other ; Confirmation:   Differential ; Clinical Dx:   Other abnormalities of gait and mobility ; Classification:   Interdisciplinary ; Clinical Service:   Non-Specified ; Code:   ICD-10-CM ; Probability:   0 ; Diagnosis Code:   R26.89        Pain Assessment   Pain Present :   Yes actual or suspected pain   Pain Present :   Foot   Laterality :   Right   Keneth ALMETA Metta LITTIE - 08/12/2019 13:54 EST   Home Environment   Living Environment :   Home Environment  Sensory Deficits:  Speech deficit  Performed By:  Chari, RN, Jessica 08/11/2019     Lives In :   Other: CHARM Keneth, PT, Tori L - 08/12/2019 13:54 EST   Home Environment II   Living Environment :   Home Environment  Sensory Deficits:  Speech deficit  Performed By:  Chari, RN, Harlene 08/11/2019  Devices/Equipment :   [place] -  Large based quad   [place], [place], Metta CROME - 08/12/2019 13:54 EST   Prior Functional Status   ADL :   modI   Mobility :   modI   Keneth, PT, Tori L - 08/12/2019 13:54 EST   LE Range/Strength   LE Overall Range of Motion Grid   Right Lower Extremity Passive Range :   Impaired   Right Lower Extremity Active Range :   Impaired   Keneth, PT, Tori L - 08/12/2019 13:54 EST   Rt Lower Extremity Strength :   Other: R LE hemiparesis   Keneth, PT, Tori L - 08/12/2019 13:54 EST   Mobility   Mobility Grid   Roll Left :   Rehab Moderate assistance   Roll Right :   Rehab Moderate assistance   Supine to Sit :   2, Rehab Maximal assistance   Scooting :   Rehab Maximal assistance   Edge of Bed Assist :   Rehab Contact guard assistance, Rehab Minimal assistance   Transfer Sit to Stand :   Rehab Maximal assistance, 2   Transfer Stand to Sit :   2, Rehab Maximal assistance   Transfer Bed To and From Chair :   2, Rehab Maximal assistance   [place], PT, Tori L - 08/12/2019 13:54 EST   Ambulation Level Acute :   Does not occur   Keneth, PT, Tori L - 08/12/2019 13:54 EST   Vital Signs   O2 Saturation :   94 %   O2 Therapy :   Nasal  cannula   O2 Flow Rate :   2 L/min   O2 Saturation :   88 % (LOW)    Keneth, PT, Tori L - 08/12/2019 13:54 EST   Assessment   PT Impairments or Limitations :   Ambulation deficits, Balance deficits, Bed mobility deficits, Endurance deficits   Barriers to Safe Discharge PT :   Past medical history   Discharge Recommendations :   Recommend DC to inpatient rehab for return to prior level of ind and return to prior living environment     D/CTransportation Recommendations :   No stretcher   D/C Transportation Recommendations Reviewed :   Yes   PT Treatment Recommendations :   Pt is a 79 y/o female admitted COVID +. Pt's PMH significant for brain aneurysm leading to R sided hemiparesis. Pt c R AFO. Unable to donn AFO due to foot pain today. MaxAX2 for mobility. Pt typically ind c tx to WC using quad cane. She propells WC c L UE/LE. Once  standing pt found to have a BM. She reprots she knew she had to have a BM, but was unable to relay to nursing. Once in chair trialed standing for cleanding, but pt too weak to stand c one assist. Pt rolled side to side for cleaning. She was left sitting up in the chair c LEs elevated and call button in reach. RN aware of pt needs of new purwik, gown, socks, and blanket. RN going into room. Will continue skilled PT tx for max return of functionl rosalba Keneth, PT, [place] L - 08/12/2019 13:54 EST   Long Term Goals   PT LT Goals Reviewed :   Chaney Keneth, PT, Tori L - 08/12/2019 13:54 EST   Outpatient PT Long Term Goals Rehab     Long Term Goal 1  Long Term Goal 2  Long Term  Goal 3      Goal :    MinA for bed mobility   STS from elevated seat c quad cane and minAX2   Tx bed <> WC c quad cane and minA        Time Frame :    10 days   10 days   10 days        Status :    Initial   Initial  (Comment:  [[place], PT, Tori L - 08/12/2019 13:54 EST] )  Initial          Keneth, PT, Tori L - 08/12/2019 13:54 EST  Keneth, PT, Tori L - 08/12/2019 13:54 EST  Keneth, PT, Tori L - 08/12/2019 13:54 EST       Short Term Goals   PT ST Goals Reviewed :   Chaney Keneth, PT, Tori L - 08/12/2019 13:54 EST   Other PT Goals Grid     Goal #1  Goal #2  Goal #3      Goal :    Supine to sitting EOB c modA   STS c quad cane and modAX2 from elevated seat   Tx bed to chair c quad cane and modAX2        Time Frame :    3 days   3 days   3 days        Status :    Initial   Initial   Initial          Keneth, PT, Tori L - 08/12/2019 13:54 EST  Keneth, PT, Tori L - 08/12/2019 13:54 EST  Keneth, PT, Tori L - 08/12/2019 13:54 EST       Plan   PT Frequency Acute :   Daily   Duration :   10    PT Duration Unit Rehab :   Days   Treatments Planned :   Balance training, Bed mobility training, Gait training, Patient education, Therapeutic activities, Therapeutic exercises   Treatment Plan/Goals Established With Patient/Caregiver :   Yes   Evaluation Complete :   Yes   Keneth, PT, Tori L -  08/12/2019 13:54 EST   Time Spent With Patient   PT Time In :   13:12 EST   PT Time Out :   13:48 EST   PT Individual Eval Time, Low Complexity :   20 minutes   PT Evaluation Units, Low Complexity :   1 Unit   PT Functional Training Units :   1 units   PT Functional Training Time :   16 minutes   PT Total Timed Code Treatment Units :   1 units   PT Total Timed Code Min :   16    PT Total Untimed Min :   20    PT Total Treatment Time Acute/OP :   [address], PT, Tori L - 08/12/2019 13:54 EST

## 2019-08-12 NOTE — Progress Notes (Signed)
Pharmacy Clinical Interventions - Text       Pharmacy Clinical Interventions Entered On:  08/12/2019 14:19 EST    Performed On:  08/12/2019 14:18 EST by BISHOP, Pharm. D., TRACY D               Interventions   Intervention Type Pharmacy :   Therapeutic duplication   Associated Order(s) Pharmacy :   dexamethasone - duplicate - voided   Clinical Importance Pharmacy :   Routine safety/clinical monitoring with no change needed   Medication Safety Reporting Pharmacy :   Medication Event   Pharmacist Intervention Time :   1-5 Minutes   BISHOP, Pharm. D., Banksy.Mul[place] D - 08/12/2019 14:18 EST

## 2019-08-12 NOTE — Nursing Note (Signed)
 Adult Clinical Swallowing - Text       Adult Clinical Swallowing Entered On:  08/12/2019 8:16 EST    Performed On:  08/12/2019 8:16 EST by BOYD, SLP, JULIE G               Reason for Treatment   *Reason for Referral :   ST consult per MD order, Pt is +COVID     *Chief Complaint :   Pt does not offer any complaints at this time     Subjective Statement :   Pt is a 79 year old female admitted for +COVID,  Pt has history of brain aneurysm resulting in aphasia over 35 years ago. 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  She has been  living in an independent living facility for 25-30 years.  At  baseline, she is able to take care of herself.  Her meals are prepared  and her room is cleaned for her, but she is able to dress up and  perform most of her activities of daily living without problems.  The  history was obtained from the son because the patient has significant  aphasia and is difficult to comprehend.      According to the son, about 8 days ago, he saw her having a hard time  getting out of a chair.  He was later called by the facility 3 days  ago that she was weak.  He therefore took her to see her primary care  physician 2 days ago where she was found to have a urinary tract  infection but physician felt that this was likely the reason for her  symptoms and started her on Bactrim.  She also performed a COVID test,  which came back positive.  She has not been able to eat or drink.  She  has been significantly weak.  Her son sent her up to get the ____  infusion but, when she got to the ER, she was found to have  electrolyte abnormalities and hypoxia and as a result has been  admitted to the hospitalist service.  She did not receive  the ____  infusion.    PAST MEDICAL HISTORY:  Aneurysm 35 years ago that was repaired.   Subsequent aphasia, apraxia, right-sided hemiparesis.  Also on seizure  prophylaxis but, in the last 35 years or so, according to the son, she  has not had a single seizure.    ASSESSMENT AND PLAN:  1.  COVID-19 disease.  From the history obtained and it appears her  symptoms may have begun 8 days ago but again this is difficult to  ascertain.  She is currently hypoxic with exertion, so she will be  started on Decadron.  ID will be consulted for consideration of  remdesivir.  Given her elevated D-dimer, she will be started on  Lovenox for hypercoagulability of COVID.  2.  Hypernatremia.  This is because the patient has not been eating  and drinking well.  Her sodium is already improving with half-normal  saline.  We will continue that.  3.  Acute kidney injury.  I expect continued improvement in her  creatinine and renal function with IV fluid hydration.  Continue  half-normal saline.  Encourage oral intake as much as possible.  4.  Escherichia coli urinary tract infection.  We will switch Bactrim  due to kidney dysfunction to  Augmentin.  She probably needs 1 or 2  more days treatment.  5.  Seizure disorder.  Will resume Dilantin.  Interestingly, she has  never had a seizure since her aneurysm 35 years ago.  She will need  followup with neurology to determine whether to continue the Dilantin  or not.  6.  Deep venous thrombosis prophylaxis.  She is on Lovenox treatment  dose.  7.  Gastroesophageal reflux disease.    Overall, time spent on this patient's admission, 53 minutes.      Linda Rosezella Gustin, MD  TR: hn DD: 08/11/2019 19:22 TD: 08/11/2019 20:51 Job#: [id number]  \[id number]\DOC#: [id number]  \[id number]\         WEISS, SLP, JULIE G - 08/12/2019 8:17 EST   General Information   Speech Orders :   Speech Language Pathology Evaluation and Treatment Acute - 08/11/19 18:57:00 EST, Stop date 08/11/19 18:57:00 EST     Precautions RTF :     Low Risk HD Precaution, 08/11/19 21:09:47 EST, Constant Order, Ordered   Communication to Nursing, 08/11/19 15:27:00 EST, Discontinue the following IV, SC or PO anticoagulants (except heparin flushes): heparin, dalteparin, bivalirudin, argatroban, fondaparinux, dabigatran, rivaroxaban, apixiban, edoxaban, 08/11/19 15:27:00 EST, 08/11/19 15:27:00 EST, Ordered   Communication to Nursing, 08/11/19 15:27:00 EST, Exclusion Criteria: Platelets less than 50k, fibrinogen less than 100, active bleeding, and/or other contraindication, 08/11/19 15:27:00 EST, 08/11/19 15:27:00 EST, Ordered   Hospitalist Team, 08/11/19 15:27:00 EST, MP Team B, Constant Indicator, Ordered   Notify Provider, 08/11/19 15:27:00 EST, bleeding or black, tarry stool, 08/11/19 15:27:00 EST, 08/11/19 15:27:00 EST, Ordered   Notify Rapid Response Team, 08/11/19 15:27:00 EST, For concerns regarding patient condition & notify MD, 08/11/19 15:27:00 EST, 08/11/19 15:27:00 EST, Ordered   Patient Isolation, 08/11/19 15:27:00 EST, Contact and Airborne, Ordered   COVID-19 Status, 08/11/19 15:06:57 EST, NOT VALID FOR pharmacy, laboratory, radiology., 08/11/19 15:06:57 EST, COVID-19 Detected, Ordered     History of Swallow Studies :   No qualifying data available     Level of Consciousness - AVPU :   Alert   Orientation Assessment :   Other: aphasia at baseline   WEISS, SLP, JULIE G - 08/12/2019 8:20 EST   Problem List   (As Of: 08/12/2019 08:25:00 EST)   Problems(Active)    Aphasia (IMO  :806216 )  Name of Problem:   Aphasia ; Recorder:   CABE, RN, ADAM Z; Confirmation:   Confirmed ; Classification:   Medical ; Code:   [id number] ; Contributor System:   PowerChart ; Last Updated:   01/22/2016 18:29 EDT ; Life Cycle Date:   01/22/2016 ; Life Cycle Status:   Active ; Vocabulary:   IMO        Apraxia (IMO  :Z097081 )  Name of Problem:   Apraxia ; Recorder:   CABE, RN, ADAM Z; Confirmation:   Confirmed ; Classification:   Medical ; Code:   Z097081 ; Contributor System:    PowerChart ; Last Updated:   01/22/2016 18:29 EDT ; Life Cycle Date:   01/22/2016 ; Life Cycle Status:   Active ; Vocabulary:   IMO        CVA (cerebral vascular accident) (IMO  :677198 )  Name of Problem:   CVA (cerebral vascular accident) ; Recorder:   DEMPSEY, RN, AMANDA L; Confirmation:  Confirmed ; Classification:   Medical ; Code:   677198 ; Contributor System:   PowerChart ; Last Updated:   07/20/2015 19:30 EST ; Life Cycle Date:   07/20/2015 ; Life Cycle Status:   Active ; Vocabulary:   IMO        Hx of seizure disorder (SNOMED CT  :588466980 )  Name of Problem:   Hx of seizure disorder ; Recorder:   DORKEWITZ, RN, BRIDGET A; Confirmation:   Confirmed ; Classification:   Patient Stated ; Code:   588466980 ; Contributor System:   PowerChart ; Last Updated:   10/22/2017 11:25 EDT ; Life Cycle Date:   10/22/2017 ; Life Cycle Status:   Active ; Vocabulary:   SNOMED CT        Hyperlipidemia (SNOMED CT  :07173982 )  Name of Problem:   Hyperlipidemia ; Recorder:   DORKEWITZ, RN, BRIDGET A; Confirmation:   Confirmed ; Classification:   Patient Stated ; Code:   07173982 ; Contributor System:   PowerChart ; Last Updated:   10/22/2017 11:24 EDT ; Life Cycle Date:   10/22/2017 ; Life Cycle Status:   Active ; Vocabulary:   SNOMED CT        Shortness of breath (IMO  :72576 )  Name of Problem:   Shortness of breath ; Recorder:   SYSTEM,  SYSTEM; Confirmation:   Confirmed ; Classification:   Patient Stated ; Code:   72576 ; Last Updated:   08/11/2019 11:54 EST ; Life Cycle Date:   08/11/2019 ; Life Cycle Status:   Active ; Vocabulary:   IMO        UI (urinary incontinence) (SNOMED CT  :8510263985 )  Name of Problem:   UI (urinary incontinence) ; Recorder:   DORKEWITZ, RN, BRIDGET A; Confirmation:   Confirmed ; Classification:   Patient Stated ; Code:   8510263985 ; Contributor System:   PowerChart ; Last Updated:   10/22/2017 11:25 EDT ; Life Cycle Date:   10/22/2017 ; Life Cycle Status:   Active ; Vocabulary:   SNOMED CT           Diagnoses(Active)    Dysphagia, unspecified  Date:   08/12/2019 ; Diagnosis Type:   Reason For Visit ; Confirmation:   Differential ; Clinical Dx:   Dysphagia, unspecified ; Classification:   Interdisciplinary ; Clinical Service:   Non-Specified ; Code:   ICD-10-CM ; Probability:   0 ; Diagnosis Code:   R13.10        Pre-Assessment   Diet Type :   Regular Diet - 08/11/19 15:27:00 EST     Tracheostomy Information :   Oxygen Flow Rate: 2 L/min  Performed by: [place] , RN, Lonell 08/12/19 04:53:00     WEISS, SLP, JULIE G - 08/12/2019 8:20 EST   Oral-Facial Exam   Facial Movement :   Within functional limits   WEISS, SLP, JULIE G - 08/12/2019 8:20 EST   Oral Structure Grid   Labial Structure :   Within functional limits   Mandible :   Within functional limits   Lingual Structure :   Within functional limits   Hard Palate :   Within functional limits   Soft Palate :   Within functional limits   WEISS, SLP, JULIE G - 08/12/2019 8:20 EST   Oral Function Grid   Labial :   Within functional limits   Mandible :   Within functional limits   Lingual :   Within functional limits  Velopharyngeal :   Within functional limits   WEISS, SLP, JULIE G - 08/12/2019 8:20 EST   Secretion Management :   Adequate   WEISS, SLP, JULIE G - 08/12/2019 8:20 EST   Swallowing Exam   Swallow Patient Position Comment :   Upright 90 degrees   SLP Swallowing Details :   Clinical Bedside Dysphagia evaluation completed. Pt exhibits oropharyngeal dysphagia related to weakness/deconditioning/+ COVID  Pt seated full upright in the bed, was alert, verbally responsive, Pt has some aphasia at baseline. Pt is +COVID therefore, appropriate airborn precautions and PPE were utilized.  Trials of thin liquids  via teaspoon/cup rim/standard straw and puree/mech soft  were administered. Pt exhibited adequate oral reception, containment, slow oral manipulation with slight delay in trigger of swallow. Adequate oral clearance and hyolarynageal elevation noted via  palpation. No coughing and vocal productions were clear. No overt s/s of aspiration on nectar thick lqiuids and puree consistencies       WEISS, SLP, JULIE G - 08/12/2019 8:20 EST   Assessment/Recommendations   Diet Consistency :   Chopped   Liquid Viscosity :   Thin   Medications :   Other: either crushed or whole in pudding/applesauce   Supervision :   1:1   Positioning :   Upright 90 degrees   Safe Feeding/Swallow Precautions :   Allow extra time to swallow, Alternate liquids/solids, Feed only when alert, Small bites of food, Small sips of liquid   Clinical Swallowing Recommendations :   Intervention not indicated   WEISS, SLP, JULIE G - 08/12/2019 8:20 EST   Time Spent With Patient   SLP Dysphagia Evaluation :   45 Minutes   SLP Dysphagia Eval Time :   45 minutes   SLP Total Individual Therapy Time :   45 minutes   SLP Total Timed Code Treatment Units :   3 units   SLP Total Tx Time :   45 minutes   WEISS, SLP, JULIE G - 08/12/2019 8:20 EST   Plan   SLP Evaluation Date :   08/12/2019 8:22 EST   Plan/Goals Established With Patient/Caregiver :   Yes   Speech Clinical Assessment Summary :   Clinical Bedside Dysphagia evaluation completed. Pt exhibits oropharyngeal dysphagia related to weakness/deconditioning/+ COVID  Pt seated full upright in the bed, was alert, verbally responsive, Pt has some aphasia at baseline. Pt is +COVID therefore, appropriate airborn precautions and PPE were utilized.  Trials of thin liquids  via teaspoon/cup rim/standard straw and puree/mech soft  were administered. Pt exhibited adequate oral reception, containment, slow oral manipulation with slight delay in trigger of swallow. Adequate oral clearance and hyolarynageal elevation noted via palpation. No coughing and vocal productions were clear. No overt s/s of aspiration on nectar thick lqiuids and puree consistencies    Rec Mech soft diet/all chopped/thin liquids.  PO meds crushed in applesauce/pudding or whole in pudding/applesauce, feed  only when alert, follow posted swallow guidelines. Offer oral supplements. Results and recommendations were reviewed with Pt , Harlene RN and Dr. Corena Please contact SLP with any questions pertaining to this evaluation         Treatment Recommendations :   see above   SLP Evaluation Complete :   Yes   WEISS, SLP, JULIE G - 08/12/2019 8:20 EST

## 2019-08-13 LAB — COMPREHENSIVE METABOLIC PANEL
ALT: 36 U/L — ABNORMAL HIGH (ref 0–33)
AST: 51 U/L — ABNORMAL HIGH (ref 0–32)
Albumin/Globulin Ratio: 1 mmol/L (ref 1.00–2.00)
Albumin: 3.1 g/dL — ABNORMAL LOW (ref 3.5–5.2)
Alk Phosphatase: 64 U/L (ref 35–117)
Anion Gap: 17 mmol/L (ref 2–17)
BUN: 19 mg/dL (ref 8–23)
CALCIUM,CORRECTED,CCA: 8.5 mg/dL — ABNORMAL LOW (ref 8.8–10.2)
CO2: 16 mmol/L — ABNORMAL LOW (ref 22–29)
Calcium: 7.8 mg/dL — ABNORMAL LOW (ref 8.8–10.2)
Chloride: 106 mmol/L (ref 98–107)
Creatinine: 0.7 mg/dL (ref 0.5–1.0)
GFR African American: 96 mL/min/{1.73_m2} (ref >=90)
GFR Non-African American: 83 mL/min/{1.73_m2} — ABNORMAL LOW (ref >=90)
Globulin: 3 g/dL (ref 1.9–4.4)
Glucose: 99 mg/dL (ref 70–99)
OSMOLALITY CALCULATED: 280 mOsm/kg (ref 270–287)
Potassium: 4.4 mmol/L (ref 3.5–5.3)
Sodium: 139 mmol/L (ref 135–145)
Total Bilirubin: 0.5 mg/dL (ref 0.00–1.20)
Total Protein: 5.7 g/dL — ABNORMAL LOW (ref 6.4–8.3)

## 2019-08-13 LAB — CBC WITH AUTO DIFFERENTIAL
Absolute Baso #: 0 10*3/uL (ref 0.0–0.2)
Absolute Eos #: 0 10*3/uL (ref 0.0–0.5)
Absolute Lymph #: 0.5 10*3/uL — ABNORMAL LOW (ref 1.0–3.2)
Absolute Mono #: 0.1 10*3/uL — ABNORMAL LOW (ref 0.3–1.0)
Basophils %: 0 % (ref 0.0–2.0)
Eosinophils %: 0 % (ref 0.0–7.0)
Hematocrit: 49.3 % — ABNORMAL HIGH (ref 34.0–47.0)
Hemoglobin: 16.5 g/dL — ABNORMAL HIGH (ref 11.5–15.7)
Immature Grans (Abs): 0.01 10*3/uL (ref 0.00–0.06)
Immature Granulocytes: 0.4 % (ref 0.1–0.6)
Lymphocytes: 17.8 % (ref 15.0–45.0)
MCH: 33.7 pg (ref 27.0–34.5)
MCHC: 33.5 g/dL (ref 32.0–36.0)
MCV: 100.8 fL — ABNORMAL HIGH (ref 81.0–99.0)
MPV: 10.7 fL (ref 7.2–13.2)
Monocytes: 4 % (ref 4.0–12.0)
Neutrophils %: 77.8 % — ABNORMAL HIGH (ref 42.0–74.0)
Neutrophils Absolute: 2.2 10*3/uL (ref 1.6–7.3)
Platelets: 98 10*3/uL — ABNORMAL LOW (ref 140–440)
RBC: 4.89 x10e6/mcL (ref 3.60–5.20)
RDW: 13.1 % (ref 11.0–16.0)
WBC: 2.8 10*3/uL — ABNORMAL LOW (ref 3.8–10.6)

## 2019-08-13 LAB — C-REACTIVE PROTEIN: CRP: 8.01 mg/dL — ABNORMAL HIGH (ref 0.00–0.50)

## 2019-08-13 LAB — D-DIMER, QUANTITATIVE: D-Dimer, Quant: 1.78 mcg/mL FEU — ABNORMAL HIGH (ref 0.19–0.51)

## 2019-08-13 LAB — PROCALCITONIN: Procalcitonin: 0.13 ng/mL (ref <=0.24)

## 2019-08-14 LAB — CBC WITH AUTO DIFFERENTIAL
Absolute Baso #: 0 10*3/uL (ref 0.0–0.2)
Absolute Eos #: 0 10*3/uL (ref 0.0–0.5)
Absolute Lymph #: 0.9 10*3/uL — ABNORMAL LOW (ref 1.0–3.2)
Absolute Mono #: 0.3 10*3/uL (ref 0.3–1.0)
Basophils %: 0.2 % (ref 0.0–2.0)
Eosinophils %: 0.5 % (ref 0.0–7.0)
Hematocrit: 39.4 % (ref 34.0–47.0)
Hemoglobin: 13.2 g/dL (ref 11.5–15.7)
Immature Grans (Abs): 0.02 10*3/uL (ref 0.00–0.06)
Immature Granulocytes: 0.5 % (ref 0.1–0.6)
Lymphocytes: 21.1 % (ref 15.0–45.0)
MCH: 33.2 pg (ref 27.0–34.5)
MCHC: 33.5 g/dL (ref 32.0–36.0)
MCV: 99 fL (ref 81.0–99.0)
MPV: 11.4 fL (ref 7.2–13.2)
Monocytes: 6.6 % (ref 4.0–12.0)
Neutrophils %: 71.1 % (ref 42.0–74.0)
Neutrophils Absolute: 2.9 10*3/uL (ref 1.6–7.3)
Platelets: 144 10*3/uL (ref 140–440)
RBC: 3.98 x10e6/mcL (ref 3.60–5.20)
RDW: 12.6 % (ref 11.0–16.0)
WBC: 4.1 10*3/uL (ref 3.8–10.6)

## 2019-08-14 LAB — COMPREHENSIVE METABOLIC PANEL
ALT: 29 U/L (ref 0–33)
AST: 29 U/L (ref 0–32)
Albumin/Globulin Ratio: 1 mmol/L (ref 1.00–2.00)
Albumin: 2.9 g/dL — ABNORMAL LOW (ref 3.5–5.2)
Alk Phosphatase: 59 U/L (ref 35–117)
Anion Gap: 17 mmol/L (ref 2–17)
BUN: 22 mg/dL (ref 8–23)
CALCIUM,CORRECTED,CCA: 9.3 mg/dL (ref 8.8–10.2)
CO2: 16 mmol/L — ABNORMAL LOW (ref 22–29)
Calcium: 8.4 mg/dL — ABNORMAL LOW (ref 8.8–10.2)
Chloride: 106 mmol/L (ref 98–107)
Creatinine: 0.7 mg/dL (ref 0.5–1.0)
GFR African American: 96 mL/min/{1.73_m2} (ref >=90)
GFR Non-African American: 83 mL/min/{1.73_m2} — ABNORMAL LOW (ref >=90)
Globulin: 2 g/dL (ref 1.9–4.4)
Glucose: 80 mg/dL (ref 70–99)
OSMOLALITY CALCULATED: 280 mOsm/kg (ref 270–287)
Potassium: 4.1 mmol/L (ref 3.5–5.3)
Sodium: 139 mmol/L (ref 135–145)
Total Bilirubin: 0.44 mg/dL (ref 0.00–1.20)
Total Protein: 5.3 g/dL — ABNORMAL LOW (ref 6.4–8.3)

## 2019-08-14 LAB — C. DIFFICILE TOXIN A/B
C Diff Toxin Interpretation: NEGATIVE
C. DIFFICILE TOXIN: NEGATIVE
C.diff Toxin/Antigen: NEGATIVE
CDiffGDH: NEGATIVE
Lot/Kit Number: 520134
Lot/Kit expire date:: 7012022

## 2019-08-14 LAB — D-DIMER, QUANTITATIVE: D-Dimer, Quant: 1.65 mcg/mL FEU — ABNORMAL HIGH (ref 0.19–0.51)

## 2019-08-14 LAB — C-REACTIVE PROTEIN: CRP: 6.38 mg/dL — ABNORMAL HIGH (ref 0.00–0.50)

## 2019-08-15 LAB — CBC WITH AUTO DIFFERENTIAL
Absolute Baso #: 0 10*3/uL (ref 0.0–0.2)
Absolute Eos #: 0 10*3/uL (ref 0.0–0.5)
Absolute Lymph #: 0.9 10*3/uL — ABNORMAL LOW (ref 1.0–3.2)
Absolute Mono #: 0.3 10*3/uL (ref 0.3–1.0)
Basophils %: 0.2 % (ref 0.0–2.0)
Eosinophils %: 0.6 % (ref 0.0–7.0)
Hematocrit: 38.8 % (ref 34.0–47.0)
Hemoglobin: 13.1 g/dL (ref 11.5–15.7)
Immature Grans (Abs): 0.02 10*3/uL (ref 0.00–0.06)
Immature Granulocytes: 0.4 % (ref 0.1–0.6)
Lymphocytes: 16.8 % (ref 15.0–45.0)
MCH: 33.2 pg (ref 27.0–34.5)
MCHC: 33.8 g/dL (ref 32.0–36.0)
MCV: 98.5 fL (ref 81.0–99.0)
MPV: 11 fL (ref 7.2–13.2)
Monocytes: 6.1 % (ref 4.0–12.0)
Neutrophils %: 75.9 % — ABNORMAL HIGH (ref 42.0–74.0)
Neutrophils Absolute: 3.8 10*3/uL (ref 1.6–7.3)
Platelets: 187 10*3/uL (ref 140–440)
RBC: 3.94 x10e6/mcL (ref 3.60–5.20)
RDW: 12.4 % (ref 11.0–16.0)
WBC: 5.1 10*3/uL (ref 3.8–10.6)

## 2019-08-15 LAB — C-REACTIVE PROTEIN: CRP: 6.41 mg/dL — ABNORMAL HIGH (ref 0.00–0.50)

## 2019-08-15 LAB — COMPREHENSIVE METABOLIC PANEL
ALT: 25 U/L (ref 0–33)
AST: 23 U/L (ref 0–32)
Albumin/Globulin Ratio: 1 mmol/L (ref 1.00–2.00)
Albumin: 2.7 g/dL — ABNORMAL LOW (ref 3.5–5.2)
Alk Phosphatase: 63 U/L (ref 35–117)
Anion Gap: 13 mmol/L (ref 2–17)
BUN: 27 mg/dL — ABNORMAL HIGH (ref 8–23)
CALCIUM,CORRECTED,CCA: 9.7 mg/dL (ref 8.8–10.2)
CO2: 19 mmol/L — ABNORMAL LOW (ref 22–29)
Calcium: 8.7 mg/dL — ABNORMAL LOW (ref 8.8–10.2)
Chloride: 107 mmol/L (ref 98–107)
Creatinine: 0.8 mg/dL (ref 0.5–1.0)
GFR African American: 82 mL/min/{1.73_m2} — ABNORMAL LOW (ref >=90)
GFR Non-African American: 71 mL/min/{1.73_m2} — ABNORMAL LOW (ref >=90)
Globulin: 2.7 g/dL (ref 1.9–4.4)
Glucose: 85 mg/dL (ref 70–99)
OSMOLALITY CALCULATED: 280 mOsm/kg (ref 270–287)
Potassium: 4.4 mmol/L (ref 3.5–5.3)
Sodium: 138 mmol/L (ref 135–145)
Total Bilirubin: 0.41 mg/dL (ref 0.00–1.20)
Total Protein: 5.4 g/dL — ABNORMAL LOW (ref 6.4–8.3)

## 2019-08-15 LAB — D-DIMER, QUANTITATIVE: D-Dimer, Quant: 1.25 mcg/mL FEU — ABNORMAL HIGH (ref 0.19–0.51)

## 2019-08-15 NOTE — Progress Notes (Signed)
Inpatient OT Evaluation - Text       Inpatient OT Evaluation Entered On:  08/15/2019 13:59 EST    Performed On:  08/15/2019 13:36 EST by EULAU, OT, CHRISTINA L               Reason for Treatment   Subjective Statement :   RN agreeable to OT. OT noted R lateral malleous and R lateral foot wounds which pt states are chronic, informed RN who assessed bedside and assisted in rolling pt for waffle overlay per OT request.      *Reason for Referral :   OT eval and treat    precautions: fall, R hemiplegia/apraxia and aphasia (responds best to yes/no questions, spontaneously speaks in short sentences at times), RUE/LE sensory impairments with increased risk for skin breakdown, 2L O2 sats marginal 90/91% at best and decreased with effort      EULAU, OT, CHRISTINA L - 08/15/2019 13:36 EST   General Information   Occupational Therapy Orders :   Occupational Therapy Evaluation and Treatment Inpatient Acute - 08/15/19 7:59:00 EST, Stop date 08/15/19 7:59:00 EST  Occupational Therapy Evaluation and Treatment Inpatient Acute - 08/11/19 18:17:00 EST, Stop date 08/11/19 18:17:00 EST     Precautions RTF :   Precaution Orders   No qualifying data available.     Pain Present :   No actual or suspected pain   Affect/Behavior :   Anxious, Cooperative, Crying   EULAU, OT, CHRISTINA L - 08/15/2019 13:36 EST   Problem List   (As Of: 08/15/2019 13:59:00 EST)   Problems(Active)    Aphasia (IMO  :[id number] )  Name of Problem:   Aphasia ; Recorder:   CABE, RN, ADAM Z; Confirmation:   Confirmed ; Classification:   Medical ; Code:   [id number] ; Contributor System:   Dietitian ; Last Updated:   01/22/2016 18:29 EDT ; Life Cycle Date:   01/22/2016 ; Life Cycle Status:   Active ; Vocabulary:   IMO        Apraxia (IMO  :[id number] )  Name of Problem:   Apraxia ; Recorder:   CABE, RN, ADAM Z; Confirmation:   Confirmed ; Classification:   Medical ; Code:   [id number] ; Contributor System:   Dietitian ; Last Updated:   01/22/2016 18:29 EDT ; Life Cycle Date:   01/22/2016 ; Life  Cycle Status:   Active ; Vocabulary:   IMO        CVA (cerebral vascular accident) (IMO  :[id number] )  Name of Problem:   CVA (cerebral vascular accident) ; Recorder:   Homero Fellers, RN, AMANDA L; Confirmation:   Confirmed ; Classification:   Medical ; Code:   [phone] ; Contributor System:   Dietitian ; Last Updated:   07/20/2015 19:30 EST ; Life Cycle Date:   07/20/2015 ; Life Cycle Status:   Active ; Vocabulary:   IMO        Hx of seizure disorder (SNOMED CT  :016010932 )  Name of Problem:   Hx of seizure disorder ; Recorder:   DORKEWITZ, RN, BRIDGET A; Confirmation:   Confirmed ; Classification:   Patient Stated ; Code:   [id number] ; Contributor System:   PowerChart ; Last Updated:   10/22/2017 11:25 EDT ; Life Cycle Date:   10/22/2017 ; Life Cycle Status:   Active ; Vocabulary:   SNOMED CT        Hyperlipidemia (SNOMED CT  :54270623 )  Name of Problem:   Hyperlipidemia ;  Recorder:   DORKEWITZ, RN, BRIDGET A; Confirmation:   Confirmed ; Classification:   Patient Stated ; Code:   [id number] ; Contributor System:   PowerChart ; Last Updated:   10/22/2017 11:24 EDT ; Life Cycle Date:   10/22/2017 ; Life Cycle Status:   Active ; Vocabulary:   SNOMED CT        Shortness of breath (IMO  :54098 )  Name of Problem:   Shortness of breath ; Recorder:   SYSTEM,  SYSTEM; Confirmation:   Confirmed ; Classification:   Patient Stated ; Code:   11914 ; Last Updated:   08/11/2019 11:54 EST ; Life Cycle Date:   08/11/2019 ; Life Cycle Status:   Active ; Vocabulary:   IMO        UI (urinary incontinence) (SNOMED CT  :7829562130 )  Name of Problem:   UI (urinary incontinence) ; Recorder:   DORKEWITZ, RN, BRIDGET A; Confirmation:   Confirmed ; Classification:   Patient Stated ; Code:   [id number] ; Contributor System:   PowerChart ; Last Updated:   10/22/2017 11:25 EDT ; Life Cycle Date:   10/22/2017 ; Life Cycle Status:   Active ; Vocabulary:   SNOMED CT          Diagnoses(Active)    Dysphagia, unspecified  Date:   08/12/2019 ; Diagnosis Type:   Reason For  Visit ; Confirmation:   Differential ; Clinical Dx:   Dysphagia, unspecified ; Classification:   Interdisciplinary ; Clinical Service:   Non-Specified ; Code:   ICD-10-CM ; Probability:   0 ; Diagnosis Code:   R13.10      Other abnormalities of gait and mobility  Date:   08/12/2019 ; Diagnosis Type:   Other ; Confirmation:   Differential ; Clinical Dx:   Other abnormalities of gait and mobility ; Classification:   Interdisciplinary ; Clinical Service:   Non-Specified ; Code:   ICD-10-CM ; Probability:   0 ; Diagnosis Code:   R26.89        Home Environment   Living Environment :   Home Environment  *ADL:  modI  Performed By:  Ace Gins, PT, Tori L 08/12/2019  *Mobility:  modI  Performed By:  Ace Gins, PT, Tori L 08/12/2019  Devices/Equipment at Home:  Gil[place] Mor - Large based quad  Performed By:  Ace Gins, PT, Tori L 08/12/2019  Lives In:  Other: ILF  Performed By:  Ace Gins, PT, Heloise Beecham 08/12/2019  Sensory Deficits:  Speech deficit  Performed By:  Arleta Creek, RN, Jessica 08/11/2019     Lives With :   Alone   Lives In :   Other: Moreen Fowler, OT, CHRISTINA L - 08/15/2019 13:36 EST   Home Environment II   Living Environment :   Home Environment  Sensory Deficits:  Speech deficit  Performed By:  Arleta Creek, RN, Jessica 08/11/2019     Devices/Equipment at Home :   [address] - Large based quad   [place], OT, CHRISTINA L - 08/15/2019 13:36 EST   Prior Functional Status Grid   ADL :   modI   Mobility :   modI   EULAU, OT, CHRISTINA L - 08/15/2019 13:36 EST   OT Basic ADL   Basic ADL Grid   Grooming :   Moderate assistance   Bathing :   Maximal assistance   UE Dressing :   Moderate assistance   LE Dressing :   Total assistance   Toileting :   Total assistance  Transfer Toilet :   Maximal assistance   EULAU, OT, CHRISTINA L - 08/15/2019 13:36 EST   Limiting Factors :   Motor   EULAU, OT, CHRISTINA L - 08/15/2019 13:36 EST   Balance   Balance Tests Performed :   [employer] balance scale   Sitting Balance Score :   0   EULAU, OT, CHRISTINA L - 08/15/2019 13:36 EST    UE Strength/ROM   Upper Extremity Overall ROM Grid   Left Upper Extremity Passive Range :   Within functional limits   Left Upper Extremity Active Range :   Within functional limits   Right Upper Extremity Passive Range :   Impaired   Right Upper Extremity Active Range :   Impaired   EULAU, OT, CHRISTINA L - 08/15/2019 13:36 EST   UE Sensation   Right Sensation Grid   Light Touch :   Absent   EULAU, OT, CHRISTINA L - 08/15/2019 13:36 EST   Impact of Impaired UE Sensation :   pt reports absent sensation R U/LEs    EULAU, OT, CHRISTINA L - 08/15/2019 13:36 EST   UE Coordination   Left :   24.11   Right :   22.49   EULAU, OT, CHRISTINA L - 08/15/2019 13:36 EST   Right Upper Extremty Coordination Grid   Diadochokinesia :   Impaired   Finger to Nose :   Impaired   Finger Opposition :   Impaired   EULAU, OT, CHRISTINA L - 08/15/2019 13:36 EST   Long Term Goals   OT LT Goals Reviewed :   Yes   EULAU, OT, CHRISTINA L - 08/15/2019 13:36 EST   Short Term Goals   OT ST Goals Reviewed :   Yes   EULAU, OT, CHRISTINA L - 08/15/2019 13:36 EST   Other OT Goal Grid     Goal #1  Goal #2  Goal #3  Goal #4    Goal :    will use incentive spirometer with SBA    increased sitting balance for self care to CGA for EOB    EOB to BSC min A    UB bathing SU        EULAU, OT, CHRISTINA L - 08/15/2019 13:36 EST  EULAU, OT, CHRISTINA L - 08/15/2019 13:36 EST  EULAU, OT, CHRISTINA L - 08/15/2019 13:36 EST  EULAU, OT, CHRISTINA L - 08/15/2019 13:36 EST        Goal #5  Goal #6        Goal :    LB bathing min A    LB dressing min A              EULAU, OT, CHRISTINA L - 08/15/2019 13:36 EST  EULAU, OT, CHRISTINA L - 08/15/2019 13:36 EST        Plan   OT Evaluation Date :   08/15/2019 EST   OT Frequency Acute :   Mo/Tu/We/Th/Fr   Duration :   2    Duration Unit :   Weeks   Planned Treatments :   Balance training, Basic Activities of Daily Living, Energy conservation training, Equipment training, Mobility training, Patient education, Safety education, Therapeutic  activities, Therapeutic exercises, Therapeutic exercises for strengthening and ROM   Treatment Plan/Goals Established With Patient/Caregiver :   Yes   OT Evaluation Complete :   Yes   EULAU, OT, CHRISTINA L - 08/15/2019 13:36 EST   Time Spent With Patient   OT Time  In :   11:03 EST   OT Time Out :   11:57 EST   OT Individual Eval Time, High Complexity :   15 minutes   OT Evaluation Units, High Complexity :   1 Unit   OT FUNCTIONAL TRNG 15 MIN :   3 units   OT Functional Training Time :   38 minutes   OT Total Timed Code Treatment Units :   3 units   OT Total Timed Code Treatment Minutes :   38 minutes   OT Total Untimed Code Treatment Minutes :   15 minutes   OT Total Treatment Time :   53 minutes   EULAU, OT, CHRISTINA L - 08/15/2019 13:36 EST   AM-PAC Daily Activity   AM-PAC Daily Activity   Putting on and taking off regular lower body clothing? :   Total = 1 point   Bathing (including washing, rinsing, drying)? :   A lot = 2 points   Toileting , which includes using toilet, bedpan or urinal? :   Total = 1 point   Putting on and taking off regular upper body clothing? :   A lot = 2 points   Taking care of personal grooming such as brushing teeth? :   A little = 3 points   Eating meals? :   A little = 3 points   EULAU, OT, CHRISTINA L - 08/15/2019 13:36 EST   AM-PAC Daily Activity Raw Score :   12    EULAU, OT, CHRISTINA L - 08/15/2019 13:36 EST   Assessment   OT Impairments or Limitations :   Balance deficits, Basic activity of daily living deficits, Coordination deficits, Decreased knowledge of condition, Endurance deficits, Home accessibility/housing, IADL deficits, Mobility deficits, Range of motion deficits, Safety awareness deficits, Sensory deficits, Strength deficits   Barriers to Safe Discharge OT :   Complicated medical history, Decreased communication, Insight into deficits, Limited family support, Limited social support, Medical diagnosis, Past medical history, Progressive nature of disease, Safety  awareness, Severity of deficits, Time since onset   OT Discharge Recommendations :   Will needs rehab setting at dc in effort to restore PLOF.      OT Treatment Recommendations :   79 yo F with baseline hx of R hemiparesis, sensory impairments and apraxia/aphasia admitted with covid. Pt required heavy max A for supine to sit and sitting EOB. Pt became anxious/tearful and wanting to return supine in spite of OT encouragement. Assisted back to supine dep x1, pt desat briefly to mid 29s with this effort but rebounded with rest break to ~90/91%. Called for RN/assist in placeing waffle overlay and pt brought upright into modified chair position. Effort in having pt use incentive spirometer and pt having significant difficulty. All needs within reach, B heels floating and pressure off of R lateral LE wounds. Discussed  with RN & MD concerns/recs for WOCN assessment as pt with open wounds, R sensory/mobility impairments and has been essentially bedbound this admission. May benefit from continued acute OT services during inpatient stay, continued effort to increase activity/participation in self care/mobility     EULAU, OT, CHRISTINA L - 08/15/2019 13:36 EST

## 2019-08-16 NOTE — Progress Notes (Signed)
 Inpatient PT Daily Documentation - Text       Inpatient PT Daily Documentation Entered On:  08/16/2019 15:24 EST    Performed On:  08/16/2019 11:05 EST by MURRELL, PTA, ANGELA E               Reason for Treatment   Subjective Statement :   (1)     *Reason for Referral :   COVID +   ***Pt answers only yes or no questions    PMH: brain aneurysm c R sided hemi-paresis, aphasia, apraxia     *Chief Complaint :   Weakness     MURRELL, PTA, ANGELA E - 08/16/2019 15:17 EST   Pain Assessment   Pain Present :   No actual or suspected pain   MURRELL, PTA, ANGELA E - 08/16/2019 15:17 EST   Therapeutic Activities/Mobility/Balance   PT Therapeutic Activities Grid     Activity 1          Activity :    Functional mobility  (Comment: SUPINE LE THEREX X 10 REPS PROM RIGHT/ A TO AAROM LEFT. SUPINE TO SIT WITH MAX ASSIST. SITTING BALANCE MIN ASSIST PROGRESSING TO CGA. SPT TO RECLINER MAX ASSIST. LE THEREX [MURRELL, PTA, ANGELA E - 08/16/2019 15:17 EST] )               MURRELL, PTA, ANGELA E - 08/16/2019 15:17 EST         Assessment   PT Impairments or Limitations :   Ambulation deficits, Balance deficits, Bed mobility deficits, Endurance deficits   Barriers to Safe Discharge PT :   Past medical history   Discharge Recommendations :   Recommend DC to inpatient rehab for return to prior level of ind and return to prior living environment     D/CTransportation Recommendations :   No stretcher   D/C Transportation Recommendations Reviewed :   Yes   PT Treatment Recommendations :   (1) PATIENT AGREEABLE TO TREATMENT. RN STATED OK TO WORK WITH PATIENT.  O: PATIENT SITTING UP IN BED UPON ARRIVAL ON 2 LITERS 02 NC. PERFORMED SUPINE BILATERAL ANKLE PUMPS, HEELSLIDES, HIP ABDUCTION/ ADDUCTION X 10 REPS EACH PROM RIGHT/ AAROM LEFT INCLUDING BILATERAL HEELCORD STRETCHING, NO PAIN. SUPINE TO SIT WITH PATIENT INITIATING WITH LEFT LE, REQUIRING MAX ASSIST TO COMPLETE. SITTING BALANCE WITH MINIMAL ASSIST INITIALLY PROGRESSING TO CGA. 02 SATS DECREASING  WHILE SITTING EOB REQUIRING 02 TO GRADUALLY BE INCREASED TO 5 LITERS TO MAINTAIN SATURATION LEVEL IN THE 90'S. PATIENT VERY ANXIOUS REQUIRING MUCH ENCOURAGEMENT AND REASSURANCE. PATIENT DID NOT WISH TO DON AFO. STAND PIVOT TRANSFER BED TO RECLINER, TO LEFT SIDE, MAXIMAL ASSIST. POSITIONED TO COMFORT IN CHAIR ON WAFFLE CUSHION. RESTED. PERFORMED BILATERAL KNEE EXTENSION AAROM LEFT/ PROM RIGHT. LE'S ELEVATED. NO FRICTION TO HEELS. CALL BELL IN HAND. CHAIR ALARM SET. NEEDS IN REACH. CALL BELL IN HAND. ASSISTED WITH DONNING PATIENT'S GLASSES AND HEADBAND AND PATIENT STATING, THANK YOU.  NURSING AWARE.  A: PATIENT PLEASANT AND COOPERATIVE. UNFORTUNATELY, SHE BECOMES VERY ANXIOUS AND FEARFUL OF FALLING WHICH I FEEL CONTRIBUTES TO MOUTH BREATHING AND DECREASED 02 SATS. DISCUSSED WITH RN. 02 TURNED BACK DOWN TO 2 LITERS NC AFTER RX AND STABLE @ 91%. DISCUSSED WITH RN. SHE WOULD BENEFIT FROM REHAB TO RETURN TO PRIOR LEVEL OF FUNCTIONAL.  P: CONTINUE TOWARD GOALS.     MURRELL, PTA, ANGELA E - 08/16/2019 15:17 EST   Time Spent With Patient   PT Functional Training Time :   28 minutes  PTA Functional Training Units :   2 units   PT Total Timed Code Treatment Units :   2 units   PT Total Timed Code Min :   28    PT Total Treatment Time Acute/OP :   28    MURRELL, PTA, ANGELA E - 08/16/2019 15:17 EST

## 2019-08-16 NOTE — Progress Notes (Signed)
Rehab Medicine Progress Note               Referral received. Chart reviewed. Patient would need to be 14 days out from symptom onset 1/19 and have a reactive antibody test. Discussed with CM.   Signature Line     Electronically Signed on 08/16/2019 11:14 AM EST   ________________________________________________   Roma Kayser, Shaune Pollack

## 2019-08-16 NOTE — Progress Notes (Signed)
Progress Note-Nurse               TB skin test placed at 1507 on R forearm.  Signature Line     Electronically Signed on 08/16/2019 03:14 PM EST   ________________________________________________   Collier Salina, RN, [place]

## 2019-08-17 LAB — CBC WITH MANUAL DIFFERENTIAL
Absolute Lymph #: 0.4 10*3/uL — ABNORMAL LOW (ref 1.0–3.2)
Absolute Mono #: 0.5 10*3/uL (ref 0.3–1.0)
Hematocrit: 38.2 % (ref 34.0–47.0)
Hemoglobin: 13 g/dL (ref 11.5–15.7)
Lymphocytes: 8 % — ABNORMAL LOW (ref 15–45)
Lymphs, Absolute Count, Reactive: 0.3 10*3/uL
MCH: 33.3 pg (ref 27.0–34.5)
MCHC: 34 g/dL (ref 32.0–36.0)
MCV: 97.9 fL (ref 81.0–99.0)
MPV: 11.1 fL (ref 7.2–13.2)
Monocytes: 10 % (ref 4–12)
Neutrophils %. Manual count: 76 % — ABNORMAL HIGH (ref 42–74)
Neutrophils Absolute: 3.9 10*3/uL (ref 1.6–7.3)
Platelet Estimate: ADEQUATE
Platelets: 228 10*3/uL (ref 140–440)
RBC Morphology: NORMAL
RBC: 3.9 x10e6/mcL (ref 3.60–5.20)
RDW: 12.2 % (ref 11.0–16.0)
Reactive Lymphocytes: 6 %
WBC: 5.1 10*3/uL (ref 3.8–10.6)

## 2019-08-17 LAB — COMPREHENSIVE METABOLIC PANEL
ALT: 23 U/L (ref 0–33)
AST: 18 U/L (ref 0–32)
Albumin/Globulin Ratio: 1 mmol/L (ref 1.00–2.00)
Albumin: 2.8 g/dL — ABNORMAL LOW (ref 3.5–5.2)
Alk Phosphatase: 75 U/L (ref 35–117)
Anion Gap: 14 mmol/L (ref 2–17)
BUN: 23 mg/dL (ref 8–23)
CALCIUM,CORRECTED,CCA: 9.4 mg/dL (ref 8.8–10.2)
CO2: 21 mmol/L — ABNORMAL LOW (ref 22–29)
Calcium: 8.4 mg/dL — ABNORMAL LOW (ref 8.8–10.2)
Chloride: 105 mmol/L (ref 98–107)
Creatinine: 0.7 mg/dL (ref 0.5–1.0)
GFR African American: 96 mL/min/{1.73_m2} (ref >=90)
GFR Non-African American: 83 mL/min/{1.73_m2} — ABNORMAL LOW (ref >=90)
Globulin: 3 g/dL (ref 1.9–4.4)
Glucose: 96 mg/dL (ref 70–99)
OSMOLALITY CALCULATED: 283 mOsm/kg (ref 270–287)
Potassium: 4 mmol/L (ref 3.5–5.3)
Sodium: 140 mmol/L (ref 135–145)
Total Bilirubin: 0.45 mg/dL (ref 0.00–1.20)
Total Protein: 5.5 g/dL — ABNORMAL LOW (ref 6.4–8.3)

## 2019-08-17 LAB — C-REACTIVE PROTEIN: CRP: 6.62 mg/dL — ABNORMAL HIGH (ref 0.00–0.50)

## 2019-08-17 LAB — LACTATE DEHYDROGENASE: LD: 146 U/L (ref 135–214)

## 2019-08-17 LAB — D-DIMER, QUANTITATIVE: D-Dimer, Quant: 1.23 mcg/mL FEU — ABNORMAL HIGH (ref 0.19–0.51)

## 2019-08-17 LAB — FERRITIN: Ferritin: 948.5 ng/mL — ABNORMAL HIGH (ref 13.0–150.0)

## 2019-08-17 NOTE — Progress Notes (Signed)
 Inpatient PT Daily Documentation - Text       Inpatient PT Daily Documentation Entered On:  08/17/2019 16:27 EST    Performed On:  08/17/2019 14:36 EST by MURRELL, PTA, ANGELA E               Reason for Treatment   Subjective Statement :   (2)     *Reason for Referral :   COVID +   ***Pt answers only yes or no questions    PMH: brain aneurysm c R sided hemi-paresis, aphasia, apraxia     *Chief Complaint :   Weakness     MURRELL, PTA, ANGELA E - 08/17/2019 16:19 EST   Pain Assessment   Pain Present :   No actual or suspected pain   MURRELL, PTA, ANGELA E - 08/17/2019 16:19 EST   Therapeutic Activities/Mobility/Balance   PT Therapeutic Activities Grid     Activity 1          Activity :    Functional mobility              Comment :    SUPINE TO SIT MOD/MAX ASSIST. SITTING BALANCE CGA. ATTEMPTED SIT TO STAND WITH QUAD CANE BUT UNSUCCESSFUL. SPT TO CHAIR MAX ASSIST X 1                MURRELL, PTA, ANGELA E - 08/17/2019 16:19 EST         Assessment   PT Impairments or Limitations :   Ambulation deficits, Balance deficits, Bed mobility deficits, Endurance deficits   Barriers to Safe Discharge PT :   Past medical history   Discharge Recommendations :   Recommend DC to inpatient rehab for return to prior level of ind and return to prior living environment     D/CTransportation Recommendations :   No stretcher   D/C Transportation Recommendations Reviewed :   Yes   PT Treatment Recommendations :   (2) PATIENT AGREEABLE TO TREATMENT. RN STATED OK TO WORK WITH PATIENT.  O: PATIENT SITTING UP IN BED UPON ARRIVAL ON 4 LITERS. SUPINE TO SIT WITH PATIENT INITIATING MOVEMENT OF LEFT SIDE, ASSISTING AS ABLE BUT REQUIRING MODERATE/MAXIMAL ASSIST TO COMPLETE. SITTING BALANCE WITH CGA. SITTING BILATERAL HIP FLEXION AND KNEE EXTENSION AND ANKLE PUMPS A/AAROM LEFT/ PROM RIGHT X 10 REPS. PATIENT BECOMING ANXIOUS WHILE SITTING REQUIRING INCREASED ENCOURAGEMENT. ATTEMPTED SIT TO STAND WITH PATIENT'S QUAD CANE ON LEFT BUT SHE BECAME FEARFUL AND  NOT WANTING TO TRY IT. SEATED. AGAIN ENCOURAGEMENT PROVIDED. PATIENT AGREEABLE TO TRANSFER TO CHAIR WITHOUT CANE. STAND PIVOT TRANSFER BED TO CHAIR WITH MAXIMAL ASSIST X 1 TO LEFT SIDE. POSITIONED TO COMFORT IN CHAIR ON WAFFLE CUSHION. 02 SATS DECREASING TO LOW 80'S AND HAVING DIFFICULTY RECOVERING. PATIENT ANXIOUS AND MOUTH BREATHING. INCREASED 02 TO 5.5 L ITERS AND 02 SATS INCREASED TO 91%. PATIENT BECAME MORE CALM AND RELAXED ONCE IN CHAIR. LE'S ELEVATED. NO FRICTION TO HEELS. CALL BELL IN HAND. CHAIR ALARM SET. RIGHT UE SUPPORTED ON PILLOW. NEEDS IN REACH. DISCUSSED TREATMENT WITH NURSING.  A: PATIENT PLEASANT AND COOPERATIVE. SHE BECOMES ANXIOUS AND FEARFUL OF FALLING WHICH CONTRIBUTES TO INCREASED SOB. SHE WOULD BENEFIT FROM REHAB TO MAXIMIZE STRENGTH AND MOBILITY TO RETURN TO PRIOR LIVING SITUATION.  P: CONTINUE TOWARD GOALS.     MURRELL, PTA, ANGELA E - 08/17/2019 16:19 EST   Time Spent With Patient   PT Functional Training Time :   17 minutes   PTA Functional Training Units :   1 units  PT Total Timed Code Treatment Units :   1 units   PT Total Timed Code Min :   17    PT Total Treatment Time Acute/OP :   17    MURRELL, PTA, ANGELA E - 08/17/2019 16:19 EST

## 2019-08-17 NOTE — Wound Image (Signed)
Wound Care Nurse Note               WOC RN Progress Note  Buttock Medial - Skin Abnormality Type: Intertriginous dermatitis  Ankle Right Lateral - Skin Abnormality Type: Abrasion  Ankle Right Lateral - Skin Abnormality Color: Yellow  Ankle Right Lateral - Incision, Wound Dressing: Open to air  Ankle Right Lateral - Incision, Wound Dressing Assessment: Dry  Ankle Right Lateral - Incision, Wound Dressing Activity: Assessed  Ankle Right Lateral - Incision, Wound Length: 1 cm  Ankle Right Lateral - Incision, Wound Width: 1 cm  Ankle Right Lateral - Incision, Wound Depth: 0 cm  Ankle Right Lateral - Wound Bed Tissue Type: Necrotic tissue, eschar  Ankle Right Lateral - Wound Percent Necrotic Tissue Eschar: 100 %  Ankle Right Lateral - Wound Exudate Amount: None  Ankle Right Lateral - Incision,Wound Surrounding Tissue Color: Normal  Ankle Right Lateral - Incision, Wound Surrounding Tissue: Intact  Ankle Right Lateral - Wound Status: Unchanged  Ankle Right Lateral - Wound Associated Pain: None  Buttock Medial - Intertriginous Dermatitis Description: Bright red, Erythema, Moist  Buttock Medial - Intertriginous dermatitis treatment: Skin protectant wipe  Buttock Medial - Intertriginous Derm Present on Admit-WOC: No    pt seen for abrasion to right lateral ankle, dry, scabbed, ota    ITD to gluteal crease and perineum. Skin prep ordered, purwick in place, slightly red and moist from incontinence,     pt repositioned, heels intact and floating   Signature Line

## 2019-08-18 NOTE — Progress Notes (Signed)
 Inpatient PT Daily Documentation - Text       Inpatient PT Daily Documentation Entered On:  08/18/2019 15:29 EST    Performed On:  08/18/2019 14:00 EST by MURRELL, PTA, ANGELA E               Reason for Treatment   Subjective Statement :   (3)     *Reason for Referral :   COVID +   ***Pt answers only yes or no questions    PMH: brain aneurysm c R sided hemi-paresis, aphasia, apraxia     *Chief Complaint :   Weakness     MURRELL, PTA, ANGELA E - 08/18/2019 15:25 EST   Pain Assessment   Pain Present :   No actual or suspected pain   MURRELL, PTA, ANGELA E - 08/18/2019 15:25 EST   Therapeutic Activities/Mobility/Balance   PT Therapeutic Activities Grid     Activity 1          Activity :    Functional mobility              Comment :    SUPINE TO SIT MOD ASSIST. SITTING BALANCE CGA. SPT TO CHAIR MAX ASSIST TO LEFT SIDE. SEATED LE THEREX AAROM LEFT/ PROM RIGHT                MURRELL, PTA, ANGELA E - 08/18/2019 15:25 EST         Assessment   PT Impairments or Limitations :   Ambulation deficits, Balance deficits, Bed mobility deficits, Endurance deficits   Barriers to Safe Discharge PT :   Past medical history   Discharge Recommendations :   Recommend DC to inpatient rehab for return to prior level of ind and return to prior living environment     D/CTransportation Recommendations :   No stretcher   D/C Transportation Recommendations Reviewed :   Yes   PT Treatment Recommendations :   (3) PATIENT AGREEABLE TO TREATMENT. RN STATED OK TO WORK WITH PATIENT.  O: PATIENT SITTING UP IN BED ON 1 LITERS 02 NC, WITH PCA PRESENT AFTER EATING LUNCH. SUPINE TO SIT WITH MODERATE ASSIST. SITTING BALANCE CGA. SIT TO STAND THEN SPT TO CHAIR MAX ASSIST. PATIENT FEARFUL OF FALLING AND NOT INTERESTED IN USING QUAD CANE TO WORK ON STANDING/ AMBULATION WITH CANE. POSITIONED TO COMFORT IN CHAIR ON WAFFLE CUSHION. 02 SATS DECREASED TO 85% THEN INCREASED TO 91% WITH REST AND DEEP BREATHING. PERFORMED SEATED BILATERAL HIP FLEXION, KNEE EXTENSION,  ANKLE PUMPS AROM LEFT WITH CUES/ PROM RIGHT X 10 REPS INCLUDING GENTLE HEELCORD STRETCHING. LE'S ELEVATED. NO FRICTION TO HEELS. SCD'S APPLIED. CHAIR ALARM SET. RIGHT UE SUPPORTED ON PILLOW, ROLL PLACED IN HAND. NEEDS IN REACH. CALL BELL IN HAND.  A: PATIENT PLEASANT AND COOPERATIVE. SHE REQUIRES ENCOURAGEMENT FOR MOBILITY SECONDARY TO ANXIETY AND FEAR OF FALLING.  P: CONTINUE TOWARD GOALS.     MURRELL, PTA, ANGELA E - 08/18/2019 15:25 EST   Time Spent With Patient   PT Functional Training Time :   15 minutes   PTA Functional Training Units :   1 units   PT Total Timed Code Treatment Units :   1 units   PT Total Timed Code Min :   15    PT Total Treatment Time Acute/OP :   15    MURRELL, PTA, ANGELA E - 08/18/2019 15:25 EST

## 2019-08-18 NOTE — Progress Notes (Signed)
Dietary Progress Note                 NUTRITION ASSESSMENT: LOS  ASSESSMENT: 79 y/o F; admitted for COVID. Symptom onset 1/19. On 1L NC. +hypernatremia 2' dehydration-resolved. AKI, UTI.  +diarrhea-c. diff negative, improved. 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 Lives in independent living facility, completes most ADLs on her own. Meal preoperation and cleaning are done for her. Poor PO intake/app pta per H&P. SLP evaluated pt 1/22, cleared for mechanical soft with all chopped foods and thin liquids. 0-50% intake this admit. Fair app per RN documentation. RD assessing pt remotely d/t social distancing as per COVID-19 directive.  PMHx: per above, seizure, esophageal dilation  LABS: reviewed   MEDS: calcium-vitamin D, steroids, famotidine, phenytoin  SKIN: scab to R ankle  OUTPUT: LBM 1/26   ALLERGIES: NKFA   CULTURAL/SPIRITUAL/RELIGIOUS PREFS: none identified  WEIGHT Hx: n/a   NFPE: deferred, RD assessing pt remotely   HT: 158cm WT: 51kg (1/21) BMI: 20.4 (underweight for age) MSJ: 943   EER: 1275-1530 kcal (25-30 kcal/kg)    EPR: 61-77 g protein (1.2-1.5 g/kg)   CHO: 159-191 g carb (50% EER)   FLUID: (45ml/kg) or per MD   DIAGNOSIS: Inadequate oral intake related to poor appetite as evidenced by pt with 0-50% intake this admit, poor app/intake pta per H&P.   GOALS:   1. Meet >80% estimated needs  2. Preserve lean body mass   INTERVENTION:   1. Agree with least restrictive diet to promote PO intake and encourage pt food prefs. Texture per SLP.    -RD to order Ensure Enlive TID (1050kcal, 60g pro) to help meet protein/energy needs.     NUTRITION DEPART/DISCHARGE - pending clinical course, available for discharge planning as needed.   Mechanical soft, all chopped. ONS 2-3 daily to help meet nutrition needs  MONITORING/EVALUATION: will monitor, intake, ONS compliance/needs, SLP notes, weight trends, skin integrity, labs, POC.   PLAN FOR FOLLOW UP: will follow-up within 6  days, consult PRN.     [place] Horstmyer, RD, LD  Clinical Dietitian  [email or web address]  [phone]  Telemediq  Signature Line     Electronically Signed on 08/18/2019 02:25 PM EST   ________________________________________________   Horstmeyer,  [place] A

## 2019-08-19 LAB — CBC WITH AUTO DIFFERENTIAL
Absolute Baso #: 0 10*3/uL (ref 0.0–0.2)
Absolute Eos #: 0.1 10*3/uL (ref 0.0–0.5)
Absolute Lymph #: 1 10*3/uL (ref 1.0–3.2)
Absolute Mono #: 0.4 10*3/uL (ref 0.3–1.0)
Basophils %: 0.2 % (ref 0.0–2.0)
Eosinophils %: 1 % (ref 0.0–7.0)
Hematocrit: 41 % (ref 34.0–47.0)
Hemoglobin: 13.5 g/dL (ref 11.5–15.7)
Immature Grans (Abs): 0.03 10*3/uL (ref 0.00–0.06)
Immature Granulocytes: 0.6 % (ref 0.1–0.6)
Lymphocytes: 19.3 % (ref 15.0–45.0)
MCH: 33.3 pg (ref 27.0–34.5)
MCHC: 32.9 g/dL (ref 32.0–36.0)
MCV: 101.2 fL — ABNORMAL HIGH (ref 81.0–99.0)
MPV: 11.5 fL (ref 7.2–13.2)
Monocytes: 7.4 % (ref 4.0–12.0)
Neutrophils %: 71.5 % (ref 42.0–74.0)
Neutrophils Absolute: 3.6 10*3/uL (ref 1.6–7.3)
Platelets: 263 10*3/uL (ref 140–440)
RBC: 4.05 x10e6/mcL (ref 3.60–5.20)
RDW: 12.3 % (ref 11.0–16.0)
WBC: 5 10*3/uL (ref 3.8–10.6)

## 2019-08-19 LAB — COMPREHENSIVE METABOLIC PANEL
ALT: 19 U/L (ref 0–33)
AST: 17 U/L (ref 0–32)
Albumin/Globulin Ratio: 1 mmol/L (ref 1.00–2.00)
Albumin: 2.6 g/dL — ABNORMAL LOW (ref 3.5–5.2)
Alk Phosphatase: 87 U/L (ref 35–117)
Anion Gap: 14 mmol/L (ref 2–17)
BUN: 22 mg/dL (ref 8–23)
CALCIUM,CORRECTED,CCA: 10.1 mg/dL (ref 8.8–10.2)
CO2: 21 mmol/L — ABNORMAL LOW (ref 22–29)
Calcium: 9 mg/dL (ref 8.8–10.2)
Chloride: 105 mmol/L (ref 98–107)
Creatinine: 0.7 mg/dL (ref 0.5–1.0)
GFR African American: 96 mL/min/{1.73_m2} (ref >=90)
GFR Non-African American: 83 mL/min/{1.73_m2} — ABNORMAL LOW (ref >=90)
Globulin: 4 g/dL (ref 1.9–4.4)
Glucose: 109 mg/dL — ABNORMAL HIGH (ref 70–99)
OSMOLALITY CALCULATED: 281 mOsm/kg (ref 270–287)
Potassium: 4.4 mmol/L (ref 3.5–5.3)
Sodium: 139 mmol/L (ref 135–145)
Total Bilirubin: 0.42 mg/dL (ref 0.00–1.20)
Total Protein: 6.3 g/dL — ABNORMAL LOW (ref 6.4–8.3)

## 2019-08-22 LAB — CBC WITH AUTO DIFFERENTIAL
Absolute Baso #: 0 10*3/uL (ref 0.0–0.2)
Absolute Eos #: 0 10*3/uL (ref 0.0–0.5)
Absolute Lymph #: 1.4 10*3/uL (ref 1.0–3.2)
Absolute Mono #: 0.6 10*3/uL (ref 0.3–1.0)
Basophils %: 0.3 % (ref 0.0–2.0)
Eosinophils %: 0.5 % (ref 0.0–7.0)
Hematocrit: 37.3 % (ref 34.0–47.0)
Hemoglobin: 12.5 g/dL (ref 11.5–15.7)
Immature Grans (Abs): 0.07 10*3/uL — ABNORMAL HIGH (ref 0.00–0.06)
Immature Granulocytes: 1.2 % — ABNORMAL HIGH (ref 0.1–0.6)
Lymphocytes: 23.5 % (ref 15.0–45.0)
MCH: 33.5 pg (ref 27.0–34.5)
MCHC: 33.5 g/dL (ref 32.0–36.0)
MCV: 100 fL — ABNORMAL HIGH (ref 81.0–99.0)
MPV: 10.2 fL (ref 7.2–13.2)
Monocytes: 9.2 % (ref 4.0–12.0)
Neutrophils %: 65.3 % (ref 42.0–74.0)
Neutrophils Absolute: 3.9 10*3/uL (ref 1.6–7.3)
Platelets: 281 10*3/uL (ref 140–440)
RBC: 3.73 x10e6/mcL (ref 3.60–5.20)
RDW: 12.3 % (ref 11.0–16.0)
WBC: 6 10*3/uL (ref 3.8–10.6)

## 2019-08-22 LAB — BASIC METABOLIC PANEL
Anion Gap: 15 mmol/L (ref 2–17)
BUN: 20 mg/dL (ref 8–23)
CO2: 21 mmol/L — ABNORMAL LOW (ref 22–29)
Calcium: 8.1 mg/dL — ABNORMAL LOW (ref 8.8–10.2)
Chloride: 102 mmol/L (ref 98–107)
Creatinine: 0.7 mg/dL (ref 0.5–1.0)
GFR African American: 96 mL/min/{1.73_m2} (ref >=90)
GFR Non-African American: 83 mL/min/{1.73_m2} — ABNORMAL LOW (ref >=90)
Glucose: 90 mg/dL (ref 70–99)
OSMOLALITY CALCULATED: 278 mOsm/kg (ref 270–287)
Potassium: 4.5 mmol/L (ref 3.5–5.3)
Sodium: 138 mmol/L (ref 135–145)

## 2019-08-22 NOTE — Progress Notes (Signed)
 Inpatient PT Daily Documentation - Text       Inpatient PT Daily Documentation Entered On:  08/22/2019 13:42 EST    Performed On:  08/22/2019 13:42 EST by Keneth, PT, Tori L               Reason for Treatment   Subjective Statement :   (4) Pt and RN agreeable to PT tx     Keneth, PT, Tori L - 08/22/2019 14:00 EST   *Reason for Referral :   COVID +   ***Pt answers only yes or no questions    PMH: brain aneurysm c R sided hemi-paresis, aphasia, apraxia     *Chief Complaint :   Weakness     Keneth, PT, Tori L - 08/22/2019 13:42 EST   Pain Assessment   Pain Present :   Yes actual or suspected pain   Pain Present :   Foot   Laterality :   Bilateral   Keneth, PT, Tori L - 08/22/2019 14:00 EST   Therapeutic Activities/Mobility/Balance   PT Therapeutic Activities Grid     Activity 1          Activity :    Functional mobility              Comment :    Supine to sitting EOB modA, scooting to EOB modA, static sitting EOB CGA, STS c SPC bed to chair c modAX2.                 Keneth, PT, Tori L - 08/22/2019 14:00 EST         Assessment   D/C Transportation Recommendations Reviewed :   Chaney Keneth, PT, Tori L - 08/22/2019 14:00 EST   PT Impairments or Limitations :   Ambulation deficits, Balance deficits, Bed mobility deficits, Endurance deficits   Barriers to Safe Discharge PT :   Past medical history   Discharge Recommendations :   Recommend DC to inpatient rehab for return to prior level of ind and return to prior living environment     D/CTransportation Recommendations :   No stretcher   Keneth, PT, Tori L - 08/22/2019 13:42 EST   PT Treatment Recommendations :   Co-tx c OT for max pt safety and participation. Pt found supine in bed stating help and moving to partial long sit in bed. Pt motioning that she needs to use the bathroom. Initially indicating wanting to use BSC, but once sitting EOB wanting to get to the recliner. Pt c purwik in place and had used purwik to urinate. Pt very fearful c movment benefitting from consistent reassurance. ModA for  supine to sitting EOB. ModAX2 for tx bed to chair. Pt on RA satting 88-91%. Pt indicating B foot/heel soreness/pain. Practived using call button. She was left sitting up in the chair c LEs elevated, heels floating, chair alarm and call button in place c OT present in room.      Keneth, PT, Tori L - 08/22/2019 14:00 EST   Time Spent With Patient   PT Time In :   11:58 EST   PT Time Out :   12:20 EST   PT Functional Training Units :   1 units   PT Functional Training Time :   15 minutes   PT Total Timed Code Treatment Units :   1 units   PT Total Timed Code Min :   15    PT Total Treatment Time Acute/OP :  [address], [place], Tori L - 08/22/2019 14:00 EST

## 2019-08-23 LAB — BASIC METABOLIC PANEL
Anion Gap: 11 mmol/L (ref 2–17)
BUN: 19 mg/dL (ref 8–23)
CO2: 23 mmol/L (ref 22–29)
Calcium: 9 mg/dL (ref 8.8–10.2)
Chloride: 104 mmol/L (ref 98–107)
Creatinine: 0.7 mg/dL (ref 0.5–1.0)
GFR African American: 96 mL/min/{1.73_m2} (ref >=90)
GFR Non-African American: 83 mL/min/{1.73_m2} — ABNORMAL LOW (ref >=90)
Glucose: 87 mg/dL (ref 70–99)
OSMOLALITY CALCULATED: 277 mOsm/kg (ref 270–287)
Potassium: 4.7 mmol/L (ref 3.5–5.3)
Sodium: 138 mmol/L (ref 135–145)

## 2019-08-23 LAB — CBC WITH AUTO DIFFERENTIAL
Absolute Baso #: 0 10*3/uL (ref 0.0–0.2)
Absolute Eos #: 0 10*3/uL (ref 0.0–0.5)
Absolute Lymph #: 1.7 10*3/uL (ref 1.0–3.2)
Absolute Mono #: 0.6 10*3/uL (ref 0.3–1.0)
Basophils %: 0.3 % (ref 0.0–2.0)
Eosinophils %: 0.6 % (ref 0.0–7.0)
Hematocrit: 38.5 % (ref 34.0–47.0)
Hemoglobin: 12.8 g/dL (ref 11.5–15.7)
Immature Grans (Abs): 0.06 10*3/uL (ref 0.00–0.06)
Immature Granulocytes: 1 % — ABNORMAL HIGH (ref 0.1–0.6)
Lymphocytes: 27.1 % (ref 15.0–45.0)
MCH: 33.5 pg (ref 27.0–34.5)
MCHC: 33.2 g/dL (ref 32.0–36.0)
MCV: 100.8 fL — ABNORMAL HIGH (ref 81.0–99.0)
MPV: 10 fL (ref 7.2–13.2)
Monocytes: 8.9 % (ref 4.0–12.0)
Neutrophils %: 62.1 % (ref 42.0–74.0)
Neutrophils Absolute: 3.8 10*3/uL (ref 1.6–7.3)
Platelets: 294 10*3/uL (ref 140–440)
RBC: 3.82 x10e6/mcL (ref 3.60–5.20)
RDW: 12.3 % (ref 11.0–16.0)
WBC: 6.2 10*3/uL (ref 3.8–10.6)

## 2019-08-23 LAB — D-DIMER, QUANTITATIVE: D-Dimer, Quant: 0.66 mcg/mL FEU — ABNORMAL HIGH (ref 0.19–0.51)

## 2019-08-23 LAB — HEPATIC FUNCTION PANEL
ALT: 15 U/L (ref 0–33)
AST: 13 U/L (ref 0–32)
Albumin: 3 g/dL — ABNORMAL LOW (ref 3.5–5.2)
Alk Phosphatase: 89 U/L (ref 35–117)
Bilirubin, Direct: <0.2 mg/dL (ref 0.00–0.30)
Total Bilirubin: 0.43 mg/dL (ref 0.00–1.20)
Total Protein: 5.8 g/dL — ABNORMAL LOW (ref 6.4–8.3)

## 2019-08-23 LAB — COVID-19, ANTIBODY, TOTAL: SARS-CoV-2 Antibody, Total: REACTIVE

## 2019-08-23 LAB — LACTIC ACID: Lactic Acid: 1.4 mmol/L (ref 0.5–2.0)

## 2019-08-23 LAB — DILANTIN: Total Phenytoin: 2.3 ug/mL — ABNORMAL LOW (ref 10.0–20.0)

## 2019-08-23 LAB — MAGNESIUM: Magnesium: 2.1 mg/dL (ref 1.6–2.6)

## 2019-08-23 LAB — PROCALCITONIN: Procalcitonin: 0.08 ng/mL (ref <=0.24)

## 2019-08-23 LAB — C-REACTIVE PROTEIN: CRP: 0.49 mg/dL (ref 0.00–0.50)

## 2019-08-23 LAB — N TERMINAL PROBNP (AKA NTPROBNP): NT Pro-BNP: 105 pg/mL (ref 0–450)

## 2019-08-23 NOTE — Case Communication (Signed)
CM D/C Planning Assessment Ongoing- Text       CM Admission Assessment Entered On:  08/23/2019 9:13 EST    Performed On:  08/16/2019 15:00 EST by Esmeralda Links J               CM Admission Assessment   CM Reason for Care Management Referral :   Discharge planning assessment   CM Insurance Information :   Medicare   CM Prescription Coverage :   Medicare D   CM Patient has PCP :   Yes   CM PCP Search :   Jonne [facility]   Discharge Transporation Needs :   Unknown   CM Legal Guardian/POA :   Son: Robert Frontera--[phone]   CM Living Situation :   Family support, Other: Independent Living   CM Patient Admitted From :   [facility]   CM Veteran :   No   Current Equipment and Treatments :   Agricultural consultant, Wheelchair   CM Initial Tentative Discharge Plan :   SNF   CM Alternate Discharge Plan :   Home with home health and private caregiver   Anticipated Hosp Related Barriers to DC :   Pending skilled nursing   CM Progress Note :   08/16/19  CJS:  CM evaluation for transition of care needs.  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  She moved to [facility] 5-6 years ago and [place] [facility].  Per report from son, at baseline, is independent with bed mobility, uses her wheelchair to move in her room and ambulates short distances from the bathroom door to the toilet etc.  She was found to be covid positive two days prior to admission and presented for Bamlan infusion when she was found to be hypoxic and was subsequently admitted.  Per PT and OT evals, she will require SNF placement secondary to significant debility and weakness prior to returning to [facility].  Referral initiated to [facility] as they are the only facility accepting covid pt's less than 10 days from date of dx or worsening sx. PPD has been ordered.     CM Admission Assessment Complete :   Yes   Esmeralda Links J - 08/23/2019 9:06 EST

## 2019-08-23 NOTE — Progress Notes (Signed)
 Inpatient PT Daily Documentation - Text       Inpatient PT Daily Documentation Entered On:  08/23/2019 14:14 EST    Performed On:  08/23/2019 11:50 EST by MURRELL, PTA, ANGELA E               Reason for Treatment   Subjective Statement :   (5)     *Reason for Referral :   COVID +   ***Pt answers only yes or no questions    PMH: brain aneurysm c R sided hemi-paresis, aphasia, apraxia     *Chief Complaint :   Weakness     MURRELL, PTA, ANGELA E - 08/23/2019 14:08 EST   Pain Assessment   Pain Present :   No actual or suspected pain   MURRELL, PTA, ANGELA E - 08/23/2019 14:08 EST   Therapeutic Activities/Mobility/Balance   PT Therapeutic Activities Grid     Activity 1          Activity :    Other: THEREX  (Comment: PERFORMED SEATED LE THEREX X 10 REPS INCLUDING STRETCHING AAROM/ PROM RIGHT AND AROM ON LEFT [MURRELL, PTA, ANGELA E - 08/23/2019 14:08 EST] )               MURRELL, PTA, ANGELA E - 08/23/2019 14:08 EST         Assessment   PT Impairments or Limitations :   Ambulation deficits, Balance deficits, Bed mobility deficits, Endurance deficits   Barriers to Safe Discharge PT :   Past medical history   Discharge Recommendations :   Recommend DC to inpatient rehab for return to prior level of ind and return to prior living environment     D/CTransportation Recommendations :   No stretcher   D/C Transportation Recommendations Reviewed :   Yes   PT Treatment Recommendations :   (5) PATIENT AGREEABLE TO TREATMENT. RN STATED OK TO WORK WITH PATIENT.  O: PATIENT SITTING UP IN CHAIR WITH LE'S ELEVATED UPON ARRIVAL. PATIENT ON ROOM AIR. PERFORMED BILATERAL ANKLE PUMPS AROM LEFT/ PROM RIGHT INCLUDING HEELCORD STRETCHING; BILATERAL HEELSLIDES, HIP ABDUCTION/ ADDUCTION AND KNEE EXTENSION AAROM RIGHT/ AROM LEFT WITH CUES X 10 REPS EACH INCLUDING LEFT HAMSTRING STRETCHING. PATIENT CLEARLY STATING, NO WHEN ASKED TO ATTEMPT STANDING. REPOSITIONED WITH LE'S ELEVATED, NO FRICTION TO HEELS. SCD'S APPLIED. CHAIR ALARM SET. RIGHT UE SUPPORTED  ON PILLOW CALL BELL IN HAND. NEEDS IN REACH.  A: PATIENT VERY PLEASANT AND COOPERATIVE WITH THEREX. NOTED ACTIVE RIGHT HIP ADDUCTION, HIP EXTENSION, HIP FLEXION AND KNEE EXTENSION TODAY. PATIENT REFUSED TO ATTEMPT STANDING. SEEMS VERY FEARFUL OF FALLING. MUCH ENCOURAGEMENT PROVIDED.  P: CONTINUE TOWARD GOALS.     MURRELL, PTA, ANGELA E - 08/23/2019 14:08 EST   Time Spent With Patient   PT Therapeutic Exercise Time :   15 minutes   PTA Therapeutic Exercise Units :   1 units   PT Total Timed Code Treatment Units :   1 units   PT Total Timed Code Min :   15    PT Total Treatment Time Acute/OP :   15    MURRELL, PTA, ANGELA E - 08/23/2019 14:08 EST

## 2019-08-23 NOTE — Case Communication (Signed)
CM Discharge Planning Assessment - Text       CM Discharge Plan Entered On:  08/23/2019 10:47 EST    Performed On:  08/23/2019 10:43 EST by Joaquin Courts               CM Discharge Plan   Current Equipment and Treatments :   Rolling walker, Wheelchair   CM Medicare Patient :   Yes   CM 3 MN Stay Validated :   Yes   CM StCM Statusatus :   IP   CM Date of 3rd MN Occurrence :   08/14/2019 0:00 EST   CM Date/Time Discharge IM :   08/23/2019 8:45 EST   Esmeralda Links J - 08/23/2019 10:43 EST   CM Progress Note :   08/16/19  CJS:  CM evaluation for transition of care needs.  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  She moved to [facility] 5-6 years ago and [place] at [facility].  Per report from son, at baseline, is independent with bed mobility, uses her wheelchair to move in her room and ambulates short distances from the bathroom door to the toilet etc.  She was found to be covid positive two days prior to admission and presented for Bamlan infusion when she was found to be hypoxic and was subsequently admitted.  Per PT and OT evals, she will require SNF placement secondary to significant debility and weakness prior to returning to [facility].  Referral initiated to [facility] as they are the only facility accepting covid pt's less than 10 days from date of dx or worsening sx. PPD has been ordered.    08/23/19  CJS:  Pt is medically stable for discharge and continues to require SNF care.  She is now past her 10 day window for SNF acceptance.  CM spoke with pt's on re. plan of care and d/c options.  He states ultimate plan is for pt to return to [facility] IL[facility].  He states pt has the finances to hire a private caregiver if necessary as well as pay the co-pay for SNF days 21-100, if pt requires continued skilled care beyond the 1st 20 days.  Some discrepancy re. PPD reading on 1/29 and this was discussed with nurse manager.  She  has spoken with RN on duty that day and the PPD was read and determined to be negative.  Documentation will be addended to support this.  Pt's son has no preference for skilled facility though if pt could be closer to the [place] area that would be great.  Medicare IM reviewed and he provides verbal understanding.  Will initiate referrals.     Joaquin Courts - 08/24/2019 17:24 EST

## 2019-08-23 NOTE — Nursing Note (Signed)
PPD Reading - Text       PPD Reading Entered On:  08/23/2019 10:10 EST    Performed On:  08/18/2019 15:00 EST by Byrd Hesselbach, RN, Billie               PPD Reading   PPD mm of Induration :   0 mm   PPD Interpretation :   Negative   Waters, RN, Billie - 08/23/2019 10:10 EST

## 2019-08-23 NOTE — Progress Notes (Signed)
Inpatient OT Daily Documentation - Text       Inpatient OT Daily Documentation Entered On:  08/23/2019 8:20 EST    Performed On:  08/22/2019 8:12 EST by EULAU, OT, CHRISTINA L               Reason for Treatment   Subjective Statement :   RN agreeable to OT. Pt pleasant and cooperative but also anxious and tearful     *Reason for Referral :   OT eval and treat    precautions: fall, R hemiplegia/apraxia and aphasia (responds best to yes/no questions, spontaneously speaks in short sentences at times), RUE/LE sensory impairments with increased risk for skin breakdown, 2L O2 sats marginal 90/91% at best and decreased with effort      EULAU, OT, CHRISTINA L - 08/23/2019 8:12 EST   Pain Assessment   Pain Present :   No actual or suspected pain   EULAU, OT, CHRISTINA L - 08/23/2019 8:12 EST   Long Term Goals   OT LT Goals Reviewed :   Yes   EULAU, OT, CHRISTINA L - 08/23/2019 8:12 EST   Time Spent With Patient   OT Time In :   12:00 EST   OT Time Out :   12:28 EST   OT Treatment Time Comment :   cotreat minus 1 unit      OT FUNCTIONAL TRNG 15 MIN :   1 units   OT Functional Training Time :   14 minutes   OT Total Timed Code Treatment Units :   1 units   OT Total Timed Code Treatment Minutes :   14 minutes   OT Total Treatment Time :   14 minutes   EULAU, OT, CHRISTINA L - 08/23/2019 8:12 EST   Assessment   OT Impairments or Limitations :   Balance deficits, Basic activity of daily living deficits, Coordination deficits, Decreased knowledge of condition, Endurance deficits, Home accessibility/housing, IADL deficits, Mobility deficits, Range of motion deficits, Safety awareness deficits, Sensory deficits, Strength deficits   Barriers to Safe Discharge OT :   Complicated medical history, Decreased communication, Insight into deficits, Limited family support, Limited social support, Medical diagnosis, Past medical history, Progressive nature of disease, Safety awareness, Severity of deficits, Time since onset   OT Discharge Recommendations  :   Will needs rehab setting at dc in effort to restore PLOF.      OT Treatment Recommendations :   tx: pt EOB with PT upon OT entry. pt seen for co treatment in effort to minimize pt anxiety with transfer as well as for pt safety. EOB to chair max A x2 with stand/pivot. once in chair and purewick replaced, appropriate rest break to increase sats on room air, pt washed face mod A, RUE positioned in abduction/neutral rotation, washcloth in palm if hand to allow for skin in palm of hand to dry to air. B heels floating, waffle cushion in place, needs within reach.   A: Pt cooperative but very anxious/tearful at times and feel this has to do with significant motor apraxia affecting her abilities for functional activities/transfers and communication combined with setting of Covid isolation. Pt is significantly weaker than her baseline and would benefit from continued acute OT services and rehab setting at dc      EULAU, OT, CHRISTINA L - 08/23/2019 8:12 EST

## 2019-08-24 LAB — BASIC METABOLIC PANEL
Anion Gap: 12 mmol/L (ref 2–17)
BUN: 28 mg/dL — ABNORMAL HIGH (ref 8–23)
CO2: 24 mmol/L (ref 22–29)
Calcium: 9.7 mg/dL (ref 8.8–10.2)
Chloride: 101 mmol/L (ref 98–107)
Creatinine: 0.8 mg/dL (ref 0.5–1.0)
GFR African American: 82 mL/min/{1.73_m2} — ABNORMAL LOW (ref >=90)
GFR Non-African American: 71 mL/min/{1.73_m2} — ABNORMAL LOW (ref >=90)
Glucose: 94 mg/dL (ref 70–99)
OSMOLALITY CALCULATED: 279 mOsm/kg (ref 270–287)
Potassium: 4.8 mmol/L (ref 3.5–5.3)
Sodium: 137 mmol/L (ref 135–145)

## 2019-08-24 LAB — MAGNESIUM: Magnesium: 2.1 mg/dL (ref 1.6–2.6)

## 2019-08-24 LAB — CBC WITH AUTO DIFFERENTIAL
Absolute Baso #: 0 10*3/uL (ref 0.0–0.2)
Absolute Eos #: 0 10*3/uL (ref 0.0–0.5)
Absolute Lymph #: 1.6 10*3/uL (ref 1.0–3.2)
Absolute Mono #: 0.7 10*3/uL (ref 0.3–1.0)
Basophils %: 0.1 % (ref 0.0–2.0)
Eosinophils %: 0.3 % (ref 0.0–7.0)
Hematocrit: 38.1 % (ref 34.0–47.0)
Hemoglobin: 12.7 g/dL (ref 11.5–15.7)
Immature Grans (Abs): 0.03 10*3/uL (ref 0.00–0.06)
Immature Granulocytes: 0.4 % (ref 0.1–0.6)
Lymphocytes: 21.4 % (ref 15.0–45.0)
MCH: 33.8 pg (ref 27.0–34.5)
MCHC: 33.3 g/dL (ref 32.0–36.0)
MCV: 101.3 fL — ABNORMAL HIGH (ref 81.0–99.0)
MPV: 9.9 fL (ref 7.2–13.2)
Monocytes: 9.5 % (ref 4.0–12.0)
Neutrophils %: 68.3 % (ref 42.0–74.0)
Neutrophils Absolute: 5.1 10*3/uL (ref 1.6–7.3)
Platelets: 291 10*3/uL (ref 140–440)
RBC: 3.76 x10e6/mcL (ref 3.60–5.20)
RDW: 12.5 % (ref 11.0–16.0)
WBC: 7.4 10*3/uL (ref 3.8–10.6)

## 2019-08-24 LAB — D-DIMER, QUANTITATIVE: D-Dimer, Quant: 0.5 mcg/mL FEU (ref 0.19–0.51)

## 2019-08-24 LAB — C-REACTIVE PROTEIN: CRP: 0.87 mg/dL — ABNORMAL HIGH (ref 0.00–0.50)

## 2019-08-24 NOTE — Progress Notes (Signed)
 Inpatient PT Daily Documentation - Text       Inpatient PT Daily Documentation Entered On:  08/24/2019 15:54 EST    Performed On:  08/24/2019 14:12 EST by MURRELL, PTA, ANGELA E               Reason for Treatment   Subjective Statement :   (6)     *Reason for Referral :   COVID +   ***Pt answers only yes or no questions    PMH: brain aneurysm c R sided hemi-paresis, aphasia, apraxia     *Chief Complaint :   Weakness     MURRELL, PTA, ANGELA E - 08/24/2019 15:50 EST   Pain Assessment   Pain Present :   No actual or suspected pain   MURRELL, PTA, ANGELA E - 08/24/2019 15:50 EST   Therapeutic Activities/Mobility/Balance   PT Therapeutic Activities Grid     Activity 1          Activity :    Functional mobility  (Comment: SUPINE TO SIT MOD ASSIST. SITTING BALANCE SBA. BILATERAL LE THEREX [MURRELL, PTA, ANGELA E - 08/24/2019 15:50 EST] )               MURRELL, PTA, ANGELA E - 08/24/2019 15:50 EST         Assessment   PT Impairments or Limitations :   Ambulation deficits, Balance deficits, Bed mobility deficits, Endurance deficits   Barriers to Safe Discharge PT :   Past medical history   Discharge Recommendations :   Recommend DC to inpatient rehab for return to prior level of ind and return to prior living environment     D/CTransportation Recommendations :   No stretcher   D/C Transportation Recommendations Reviewed :   Yes   PT Treatment Recommendations :   (6) PATIENT AGREEABLE TO SIT EOB BUT REFUSING TO GET OOB IN CHAIR. RN STATED OK TO WORK WITH PATIENT.  O: PATIENT SUPINE UPON ARRIVAL. SUPINE TO SIT WITH PATIENT INITIATING AND ABLE TO LIFT TRUNK BUT REQUIRING MODERATE ASSIST TO COMPLETE. SITTING BALANCE WITH SBA. PERFORMED BILATERAL HIP FLEXION, KNEE EXTENSION AROM LEFT/ AAROM RIGHT X 10 REPS. PATIENT REQUESTED TO RETURN SUPINE. POSITIONED SUPINE MOD ASSIST X 2. HOB ELEVATED INTO SITTING. NO FRICTION TO HEELS. PERFORMED BILATERAL HEELCORD STRETCHING. SCD'S APPLIED. CALL BELL IN HAND. BED ALARM SET. RIGHT UE ELEVATED ON  PILLOW. NEEDS IN REACH.  A: PATIENT VERY PLEASANT AND COOPERATIVE. SHE WOULD BENEFIT FROM REHAB TO MAXIMIZE FUNCTIONAL MOBILITY  P: CONTINUE TOWARD GOALS.     MURRELL, PTA, ANGELA E - 08/24/2019 15:50 EST   Time Spent With Patient   PT Functional Training Time :   15 minutes   PTA Functional Training Units :   1 units   PT Total Timed Code Treatment Units :   1 units   PT Total Timed Code Min :   15    PT Total Treatment Time Acute/OP :   15    MURRELL, PTA, ANGELA E - 08/24/2019 15:50 EST

## 2019-08-24 NOTE — Progress Notes (Signed)
 Dietary Progress Note               NUTRITION FOLLOW UP: SCHEDULED F/U  ASSESSMENT: 79 y/o F; admitted for COVID. Symptom onset 1/19. On room air. +hypernatremia 2' dehydration-resolved. AKI, UTI.  +diarrhea-c. diff negative, improved. 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 Lives in independent living facility, completes most ADLs on her own. Meal preoperation and cleaning are done for her. Poor PO intake/app pta per H&P. SLP evaluated pt 1/22, cleared for mechanical soft with all chopped foods and thin liquids. 25-50% intake most meals. Ensure Enlive TID ordered-compliant with most. has refused 1 daily in the last few days. Poor app per RN documentation. Likely d/c to rehab soon. COVID infection resolved. RD assessing pt remotely d/t social distancing as per COVID-19 directive.  PMHx: per above, seizure, esophageal dilation  LABS: BUN 28  MEDS: calcium-vitamin D, famotidine, phenytoin  SKIN: scab to R ankle-no change documented  OUTPUT: LBM 2/3   ALLERGIES: NKFA   CULTURAL/SPIRITUAL/RELIGIOUS PREFS: none identified  WEIGHT Hx: n/a   NFPE: deferred, RD assessing pt remotely   HT: 158cm WT: 51kg (1/21) BMI: 20.4 (underweight for age) MSJ: 943   EER: 1275-1530 kcal (25-30 kcal/kg)    EPR: 61-77 g protein (1.2-1.5 g/kg)   CHO: 159-191 g carb (50% EER)   FLUID: (39ml/kg) or per MD   *unchanged from previous assessment  DIAGNOSIS: Inadequate oral intake -remains applicable, addressing with improved PO intake and ONS  GOALS:   1. Meet >80% estimated needs-meeting with 25-50% intake of meals on average and ONS BID compliance  2. Preserve lean body mass -continues  INTERVENTION:   1. Agree with least restrictive diet to promote PO intake and encourage pt food prefs. Texture per SLP.    -RD to order Ensure Enlive TID (1050kcal, 60g pro) to help meet protein/energy needs.   -Pt currently meeting 100%EER/EPR with 25-50% intake of meals on average and ONS BID.      NUTRITION  DEPART/DISCHARGE - pending clinical course, available for discharge planning as needed.   Mechanical soft, all chopped. ONS 2-3 daily to help meet nutrition needs  MONITORING/EVALUATION: will monitor, intake, ONS compliance/needs, SLP notes, weight trends, skin integrity, labs, POC.   PLAN FOR FOLLOW UP: will follow-up within 7 days, consult PRN.     [place] Horstmyer, RD, LD  Clinical Dietitian  [email or web address]  [phone]  Telemediq  Signature Line     Electronically Signed on 08/24/2019 02:28 PM EST   ________________________________________________   Horstmeyer,  [place] A

## 2019-08-24 NOTE — Nursing Note (Signed)
Nursing Discharge Summary - Text       Physician Discharge Summary Entered On:  08/24/2019 11:35 EST    Performed On:  08/24/2019 11:35 EST by Janee Morn, MD, Madlyn Frankel               DC Information   Provider Instructions for Diet :   A Healthy Diet, Soft diet, Cardiac diet, High fiber diet, Drink lots of fluids that do not have caffeine   Provider Supplement Instructions :   Ensure Clear or Equivalent, Ensure Pudding or equivalent   Provider Instructions for Activity :   As Tolerated, Continue your regular activity, Use walker at all times   Janee Morn, MD, Normand Sloop T - 08/24/2019 11:35 EST

## 2019-08-24 NOTE — Discharge Summary (Signed)
Inpatient Clinical Summary             [facility]  Post-Acute Care Transfer Instructions  PERSON INFORMATION   Name: Linda Murray, Linda Murray  MRN: [id number]    FIN#: [id number]   PHYSICIANS  Admitting Physician: Susa Raring  Attending Physician: Susa Raring   PCP: Jonne Ply SEA  Discharge Diagnosis:  1:Pneumonia due to COVID-19 virus; 2:CVA (cerebral vascular accident); 3:Hx of seizure disorder; 4:Aphasia; 5:Hypernatremia; 6:E-coli UTI; 7:Diarrhea; 8:Hyperlipidemia  Comment:       PATIENT EDUCATION INFORMATION  Instructions:               Medication Leaflets:               Follow-up:                           With: Address: When:   KATHERINE FRIZELLE-MD [address], [address] [place] [address]  [phone] Business (1) Within 2 to 4 weeks                             MEDICATION LIST  Medication Reconciliation at Discharge:          New Medications  Other Medications  Al hydroxide/Mg hydroxide/simethicone (aluminum hydroxide/magnesium hydroxide/simethicone 200 mg-200 mg-20 mg/5 mL oral suspension) 30 Milliliter Oral (given by mouth) every 3 hours as needed indigestion.  Last Dose:____________________  Medications That Were Updated - Follow Current Instructions  Other Medications  Current phenyTOIN (Dilantin 100 mg oral capsule, extended release) one capule in am and two in pm.  Last Dose:____________________    Medications that have not changed  Other Medications  calcium-vitamin D (Caltrate 600 + D oral tablet) 1 Tabs Oral (given by mouth) 2 times a day.  Last Dose:____________________  denosumab (Prolia 60 mg/mL subcutaneous solution) 1 Milliliter Subcutaneous (under the skin) every 6 months.  Last Dose:____________________  omeprazole (PriLOSEC) 40 Milligram Oral (given by mouth) every day.  Last Dose:____________________  No Longer Take the Following Medications  calcium carbonate (Caltrate) Oral (given by mouth) every day.  Stop Taking Reason: Physician  Request  nystatin (nystatin 100,000 units/mL oral suspension)   Stop Taking Reason: Physician Request  sulfamethoxazole-trimethoprim (sulfamethoxazole-trimethoprim 800 mg-160 mg oral tablet)   Stop Taking Reason: Physician Request         Patient???s Final Home Medication List Upon Discharge:           Al hydroxide/Mg hydroxide/simethicone (aluminum hydroxide/magnesium hydroxide/simethicone 200 mg-200 mg-20 mg/5 mL oral suspension) 30 Milliliter Oral (given by mouth) every 3 hours as needed indigestion.  calcium-vitamin D (Caltrate 600 + D oral tablet) 1 Tabs Oral (given by mouth) 2 times a day.  denosumab (Prolia 60 mg/mL subcutaneous solution) 1 Milliliter Subcutaneous (under the skin) every 6 months.  omeprazole (PriLOSEC) 40 Milligram Oral (given by mouth) every day.  phenyTOIN (Dilantin 100 mg oral capsule, extended release) one capule in am and two in pm.         Comment:       ORDERS          Order Name Order Details

## 2019-08-24 NOTE — Case Communication (Signed)
CM Discharge Planning Assessment - Text       CM Progress Note Entered On:  08/24/2019 17:26 EST    Performed On:  08/24/2019 17:26 EST by Esmeralda Links J               CM Progress Note   CM Initial Tentative Discharge Plan :   SNF   CM Alternate Discharge Plan :   Home with home health and private caregiver   CM Progress Note :   08/16/19  CJS:  CM evaluation for transition of care needs.  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  She moved to [facility] 5-6 years ago and [place] [facility] [place] [facility].  Per report from son, at baseline, is independent with bed mobility, uses her wheelchair to move in her room and ambulates short distances from the bathroom door to the toilet etc.  She was found to be covid positive two days prior to admission and presented for Bamlan infusion when she was found to be hypoxic and was subsequently admitted.  Per PT and OT evals, she will require SNF placement secondary to significant debility and weakness prior to returning to [facility].  Referral initiated to [facility] as they are the only facility accepting covid pt's less than 10 days from date of dx or worsening sx. PPD has been ordered.    08/23/19  CJS:  Pt is medically stable for discharge and continues to require SNF care.  She is now past her 10 day window for SNF acceptance.  CM spoke with pt's on re. plan of care and d/c options.  He states ultimate plan is for pt to return to [facility].  He states pt has the finances to hire a private caregiver if necessary as well as pay the co-pay for SNF days 21-100, if pt requires continued skilled care beyond the 1st 20 days.  Some discrepancy re. PPD reading on 1/29 and this was discussed with nurse manager.  She has spoken with RN on duty that day and the PPD was read and determined to be negative.  Documentation will be addended to support this.  Pt's son has no preference for skilled facility  though if pt could be closer to the [place] area that would be great.  Medicare IM reviewed and he provides verbal understanding.  Will initiate referrals.    08/24/19  CJS:  Pt with several bed offers for rehab.  CM will present these to son and anticipate d/c tomorrow.     Esmeralda Links J - 08/24/2019 17:26 EST

## 2019-08-24 NOTE — Discharge Summary (Signed)
Inpatient Patient Summary                 [facility]  [address] [place] [address]  [phone]  Patient Discharge Instructions     Name: Linda Murray, Linda Murray  Current Date: 08/24/2019 11:35:36  DOB: 1940-10-26 [id number] FIN:[phone]  Patient Address: [place] [address]  Patient Phone: [phone]  Primary Care Provider:  Name: Kinnie Scales  Phone: [phone]  Immunizations Provided:         Immunization(s) Given This Visit    Name Date   tuberculin purified protein derivative 08/16/19 15:07:00          Discharge Diagnosis: 1:Pneumonia due to COVID-19 virus; 2:CVA (cerebral vascular accident); 3:Hx of seizure disorder; 4:Aphasia; 5:Hypernatremia; 6:E-coli UTI; 7:Diarrhea; 8:Hyperlipidemia  Discharged To: TO, ANTICIPATED%>  Home Treatments: TREATMENTS, ANTICIPATED%>  Devices/Equipment: EQUIPMENT REHAB%>Cane - Large based quad  [place]: HOSPITAL SERVICES%>  Professional Skilled Services: SKILLED SERVICES%>  Education administrator and Community Resources: SERV AND COMM RES, ANTICIPATED%>  Mode of Discharge Transportation: TRANSPORTATION%>  Discharge Orders:       Comment:   Medications  During the course of your visit, your medication list was updated with the most current information. The details of those changes are reflected below:          New Medications  Other Medications  Al hydroxide/Mg hydroxide/simethicone (aluminum hydroxide/magnesium hydroxide/simethicone 200 mg-200 mg-20 mg/5 mL oral suspension) 30 Milliliter Oral (given by mouth) every 3 hours as needed indigestion.  Last Dose:____________________  Medications That Were Updated - Follow Current Instructions  Other Medications  Current phenyTOIN (Dilantin 100 mg oral capsule, extended release) one capule in am and two in pm.  Last Dose:____________________    Medications that have not changed  Other Medications  calcium-vitamin D (Caltrate 600 + D oral tablet) 1 Tabs Oral (given by  mouth) 2 times a day.  Last Dose:____________________  denosumab (Prolia 60 mg/mL subcutaneous solution) 1 Milliliter Subcutaneous (under the skin) every 6 months.  Last Dose:____________________  omeprazole (PriLOSEC) 40 Milligram Oral (given by mouth) every day.  Last Dose:____________________  No Longer Take the Following Medications  calcium carbonate (Caltrate) Oral (given by mouth) every day.  Stop Taking Reason: Physician Request  nystatin (nystatin 100,000 units/mL oral suspension)   Stop Taking Reason: Physician Request  sulfamethoxazole-trimethoprim (sulfamethoxazole-trimethoprim 800 mg-160 mg oral tablet)   Stop Taking Reason: Physician Request      [facility] would like to thank you for allowing [place] to assist you with your healthcare needs. The following includes patient education materials and information regarding your injury/illness.  Rico, Vern has been given the following list of follow-up instructions, prescriptions, and patient education materials:  Follow-up Instructions:              With: Address: When:   KATHERINE FRIZELLE-MD [address], [place] [address]  [phone] Business (1) Within 2 to 4 weeks                  It is important to always keep an active list of medications available so that you can share with other providers and manage your medications appropriately. As an additional courtesy, we are also providing you with your final active medications list that you can keep with you.           Al hydroxide/Mg hydroxide/simethicone (aluminum hydroxide/magnesium hydroxide/simethicone 200 mg-200 mg-20 mg/5 mL  oral suspension) 30 Milliliter Oral (given by mouth) every 3 hours as needed indigestion.  calcium-vitamin D (Caltrate 600 + D oral tablet) 1 Tabs Oral (given by mouth) 2 times a day.  denosumab (Prolia 60 mg/mL subcutaneous solution) 1 Milliliter Subcutaneous (under the skin) every 6 months.  omeprazole (PriLOSEC) 40 Milligram Oral (given  by mouth) every day.  phenyTOIN (Dilantin 100 mg oral capsule, extended release) one capule in am and two in pm.      Take only the medications listed above. Contact your doctor prior to taking any medications not on this list.  Discharge instructions, if any, will display below     Instructions for Diet: INSTRUCTIONS FOR DIET%>A Healthy Diet, Soft diet, Cardiac diet, High fiber diet, Drink lots of fluids that do not have caffeine  Instructions for Supplements: SUPPLEMENT INSTRUCTIONS%>Ensure Clear or Equivalent, Ensure Pudding or equivalent  Instructions for Activity: INSTRUCTIONS FOR ACTIVITY%>As Tolerated, Continue your regular activity, Use walker at all times  Instructions for Wound Care: INSTRUCTIONS FOR WOUND CARE%>     Medication leaflets, if any, will display below    Patient education materials, if any, will display below       IS IT A STROKE?  Act FAST and Check for these signs:    FACE                          Does the face look uneven?    ARM                          Does one arm drift down?    SPEECH                     Does their speech sound strange?    TIME                          Call 9-1-1 at any sign of stroke  Heart Attack Signs  Chest discomfort: Most heart attacks involve discomfort in the center of the chest and lasts more than a few minutes, or goes away and comes back. It can feel like uncomfortable pressure, squeezing, fullness or pain.  Discomfort in upper body: Symptoms can include pain or discomfort in one or both arms, back, neck, jaw or stomach.  Shortness of breath: With or without discomfort.  Other signs: Breaking out in a cold sweat, nausea, or lightheaded.  Remember, MINUTES DO MATTER. If you experience any of these heart attack warning signs, call 9-1-1 to get immediate medical attention!     ---------------------------------------------------------------------------------------------------------------------  [facility] allows you to manage your health, view your test  results, and retrieve your discharge documents from your hospital stay securely and conveniently from your computer.  To begin the enrollment process, visit [email or web address] Click on ???Sign up now??? under [facility].

## 2019-08-25 NOTE — Nursing Note (Signed)
Nursing Discharge Summary - Text       Nursing Discharge Summary Entered On:  08/25/2019 13:12 EST    Performed On:  08/25/2019 13:12 EST by Reymundo Poll, RN, Roseanna Rainbow               DC Information   Discharge To :   Skilled Nursing Facility - Short Term   Mode of Discharge :   Stretcher   Transportation :   Ground ambulance   Accompanied By :   Howell Pringle, RN, Mayme Genta M - 08/25/2019 13:12 EST

## 2019-08-25 NOTE — Progress Notes (Signed)
 Inpatient PT Daily Documentation - Text       Inpatient PT Daily Documentation Entered On:  08/25/2019 14:28 EST    Performed On:  08/25/2019 13:10 EST by MURRELL, PTA, ANGELA E               Reason for Treatment   Subjective Statement :   (7)     *Reason for Referral :   COVID +   ***Pt answers only yes or no questions    PMH: brain aneurysm c R sided hemi-paresis, aphasia, apraxia     *Chief Complaint :   Weakness     MURRELL, PTA, ANGELA E - 08/25/2019 14:24 EST   Pain Assessment   Pain Present :   No actual or suspected pain   MURRELL, PTA, ANGELA E - 08/25/2019 14:24 EST   Therapeutic Activities/Mobility/Balance   PT Therapeutic Activities Grid     Activity 1          Activity :    Other: THEREX  (Comment: SUPINE LE THEREX A/AAROM/ PROM  [MURRELL, PTA, ANGELA E - 08/25/2019 14:24 EST] )               MURRELL, PTA, ANGELA E - 08/25/2019 14:24 EST         Assessment   PT Impairments or Limitations :   Ambulation deficits, Balance deficits, Bed mobility deficits, Endurance deficits   Barriers to Safe Discharge PT :   Past medical history   Discharge Recommendations :   Recommend DC to inpatient rehab for return to prior level of ind and return to prior living environment     D/CTransportation Recommendations :   No stretcher   D/C Transportation Recommendations Reviewed :   Yes   PT Treatment Recommendations :   (7) PATIENT AGREEABLE TO TREATMENT. RN STATED OK TO WORK WITH PATIENT BUT BEING DISCHARGED IN 1 HOUR.  O: PATIENT SUPINE IN BED UPON ARRIVAL ON ROOM AIR. PERFORMED LEFT ANKLE PUMPS AROM, HEELSLIDES AND HIP ABDUCTION AAROM X 10 REPS WITH NO FRICTION TO HEELS; RIGHT HEELSLIDES, HIP ABDUCTION/ ADDUCTION BOTH AAROM THEN PASSIVE HEELCORD STRETCHING. ASSISTED PATIENT WITH SCOOTING UP IN BED FOR IMPROVED UPRIGHT POSITIONING MOD/MAX ASSIST, USING LEFT SIDE TO ASSIST AS ABLE. POSITIONED TO COMFORT WITH HOB ELEVATED. NO FRICTION TO HEELS. SCD'S APPLIED. RIGHT UE SUPPORTED ON PILLOW. BED ALARM SET. NEEDS IN REACH.  A: PATIENT  PLEASANT AND COOPERATIVE. SHE WOULD BENEFIT FROM REHAB TO MAXIMIZE FUNCTIONAL MOBILITY AND RETURN TO BASELINE.  P: CONTINUE TOWARD GOALS.     MURRELL, PTA, ANGELA E - 08/25/2019 14:24 EST   Time Spent With Patient   PT Therapeutic Exercise Time :   12 minutes   PTA Therapeutic Exercise Units :   1 units   PT Total Timed Code Treatment Units :   1 units   PT Total Timed Code Min :   12    PT Total Treatment Time Acute/OP :   12    MURRELL, PTA, ANGELA E - 08/25/2019 14:24 EST

## 2019-11-24 LAB — COMPREHENSIVE METABOLIC PANEL
ALT: 9 U/L (ref 0–33)
AST: 14 U/L (ref 0–32)
Albumin/Globulin Ratio: 1.7 mmol/L (ref 1.00–2.00)
Albumin: 4.5 g/dL (ref 3.5–5.2)
Alk Phosphatase: 108 U/L (ref 35–117)
Anion Gap: 9 mmol/L (ref 2–17)
BUN: 21 mg/dL (ref 8–23)
CO2: 25 mmol/L (ref 22–29)
Calcium: 10.1 mg/dL (ref 8.8–10.2)
Chloride: 108 mmol/L — ABNORMAL HIGH (ref 98–107)
Creatinine: 0.9 mg/dL (ref 0.5–1.0)
GFR African American: 71 mL/min/{1.73_m2} — ABNORMAL LOW (ref >=90)
GFR Non-African American: 61 mL/min/{1.73_m2} — ABNORMAL LOW (ref >=90)
Globulin: 3 g/dL (ref 1.9–4.4)
Glucose: 87 mg/dL (ref 70–99)
OSMOLALITY CALCULATED: 285 mOsm/kg (ref 270–287)
Potassium: 4.7 mmol/L (ref 3.5–5.3)
Sodium: 142 mmol/L (ref 135–145)
Total Bilirubin: 0.2 mg/dL (ref 0.00–1.20)
Total Protein: 7.1 g/dL (ref 6.4–8.3)

## 2019-11-24 LAB — CBC WITH AUTO DIFFERENTIAL
Absolute Baso #: 0.1 10*3/uL (ref 0.0–0.2)
Absolute Eos #: 0.2 10*3/uL (ref 0.0–0.5)
Absolute Lymph #: 1.9 10*3/uL (ref 1.0–3.2)
Absolute Mono #: 0.5 10*3/uL (ref 0.3–1.0)
Basophils %: 0.8 % (ref 0.0–2.0)
Eosinophils %: 2.4 % (ref 0.0–7.0)
Hematocrit: 37.9 % (ref 34.0–47.0)
Hemoglobin: 12.9 g/dL (ref 11.5–15.7)
Immature Grans (Abs): 0.01 10*3/uL (ref 0.00–0.06)
Immature Granulocytes: 0.2 % (ref 0.1–0.6)
Lymphocytes: 28 % (ref 15.0–45.0)
MCH: 34 pg (ref 27.0–34.5)
MCHC: 34 g/dL (ref 32.0–36.0)
MCV: 100 fL — ABNORMAL HIGH (ref 81.0–99.0)
MPV: 10.2 fL (ref 7.2–13.2)
Monocytes: 7.4 % (ref 4.0–12.0)
NRBC Absolute: 0 10*3/uL (ref 0.000–0.012)
NRBC Automated: 0 % (ref 0.0–0.2)
Neutrophils %: 61.2 % (ref 42.0–74.0)
Neutrophils Absolute: 4 10*3/uL (ref 1.6–7.3)
Platelets: 281 10*3/uL (ref 140–440)
RBC: 3.79 x10e6/mcL (ref 3.60–5.20)
RDW: 12 % (ref 11.0–16.0)
WBC: 6.6 10*3/uL (ref 3.8–10.6)

## 2019-11-24 LAB — DILANTIN: Total Phenytoin: 5.5 ug/mL — ABNORMAL LOW (ref 10.0–20.0)

## 2019-11-24 LAB — VITAMIN B12: Vitamin B-12: 349 pg/mL (ref 232–1245)

## 2019-11-24 LAB — FOLATE: Folate: >20 ng/mL (ref 4.80–24.20)

## 2019-11-25 LAB — URINALYSIS WITH REFLEX TO CULTURE
Amorphous, UA: NONE SEEN /HPF
Bilirubin Urine: NEGATIVE
Glucose, UA: NEGATIVE mg/dL
Ketones, Urine: NEGATIVE mg/dL
Nitrite, Urine: POSITIVE — AB
Protein, UA: 30 — AB
Specific Gravity, UA: 1.024 (ref 1.003–1.035)
Urobilinogen, Urine: 0.2 EU/dL
pH, UA: 6 (ref 4.5–8.0)

## 2019-11-27 LAB — CULTURE, URINE: FINAL REPORT: >100000

## 2020-01-29 LAB — BASIC METABOLIC PANEL
Anion Gap: 10 mmol/L (ref 2–17)
BUN: 23 mg/dL (ref 8–23)
CO2: 21 mmol/L — ABNORMAL LOW (ref 22–29)
Calcium: 9.3 mg/dL (ref 8.8–10.2)
Chloride: 106 mmol/L (ref 98–107)
Creatinine: 0.8 mg/dL (ref 0.5–1.0)
GFR African American: 81 mL/min/{1.73_m2} — ABNORMAL LOW (ref >=90)
GFR Non-African American: 70 mL/min/{1.73_m2} — ABNORMAL LOW (ref >=90)
Glucose: 106 mg/dL — ABNORMAL HIGH (ref 70–99)
OSMOLALITY CALCULATED: 278 mOsm/kg (ref 270–287)
Sodium: 137 mmol/L (ref 135–145)

## 2020-01-29 LAB — CBC WITH AUTO DIFFERENTIAL
Absolute Baso #: 0 10*3/uL (ref 0.0–0.2)
Absolute Eos #: 0.1 10*3/uL (ref 0.0–0.5)
Absolute Lymph #: 1.4 10*3/uL (ref 1.0–3.2)
Absolute Mono #: 0.4 10*3/uL (ref 0.3–1.0)
Basophils %: 0.6 % (ref 0.0–2.0)
Eosinophils %: 1.9 % (ref 0.0–7.0)
Hematocrit: 33.3 % — ABNORMAL LOW (ref 34.0–47.0)
Hemoglobin: 11.3 g/dL — ABNORMAL LOW (ref 11.5–15.7)
Immature Grans (Abs): 0.02 10*3/uL (ref 0.00–0.06)
Immature Granulocytes: 0.3 % (ref 0.1–0.6)
Lymphocytes: 20.3 % (ref 15.0–45.0)
MCH: 34.3 pg (ref 27.0–34.5)
MCHC: 33.9 g/dL (ref 32.0–36.0)
MCV: 101.2 fL — ABNORMAL HIGH (ref 81.0–99.0)
MPV: 9.6 fL (ref 7.2–13.2)
Monocytes: 6.5 % (ref 4.0–12.0)
NRBC Absolute: 0 10*3/uL (ref 0.000–0.012)
NRBC Automated: 0 % (ref 0.0–0.2)
Neutrophils %: 70.4 % (ref 42.0–74.0)
Neutrophils Absolute: 4.8 10*3/uL (ref 1.6–7.3)
Platelets: 340 10*3/uL (ref 140–440)
RBC: 3.29 x10e6/mcL — ABNORMAL LOW (ref 3.60–5.20)
RDW: 13.7 % (ref 11.0–16.0)
WBC: 6.8 10*3/uL (ref 3.8–10.6)

## 2020-01-29 LAB — LACTIC ACID: Lactic Acid: 0.8 mmol/L (ref 0.5–2.0)

## 2020-01-29 NOTE — ED Notes (Signed)
 ED Patient Summary       ;       [facility] Emergency Department  [address] [place] [address]  [phone]  Discharge Instructions (Patient)  _______________________________________     Name: Linda Murray, Linda Murray  DOB: 11-Jul-1941                   MRN: [id number]                   FIN: [id number]  Reason For Visit: Foot laceration repair; RIGHT FOOT PAIN  Final Diagnosis: Foot laceration; Wound infection     Visit Date: 01/29/2020 11:43:00  Address: [address] [place] [address]  Phone: [phone]     Emergency Department Providers:         Primary Physician:            [facility] would like to thank you for allowing us  to assist you with your healthcare needs. The following includes patient education materials and information regarding your injury/illness.     Follow-up Instructions:  You were seen today on an emergency basis. Please contact your primary care doctor for a follow up appointment. If you received a referral to a specialist doctor, it is important you follow-up as instructed.    It is important that you call your follow-up doctor to schedule and confirm the location of your next appointment. Your doctor may practice at multiple locations. The office location of your follow-up appointment may be different to the one written on your discharge instructions.    If you do not have a primary care doctor, please call [phone] for help in finding a [name] Provider. For help in finding a specialist doctor, please call [phone].    If your condition gets worse before your follow-up with your primary care doctor or specialist, please return to the Emergency Department.      Coronavirus 2019 (COVID-19) Reminders:     Patients age 83 - 36, with parental consent, and patients over age 49 can make an appointment for a COVID-19 vaccine. Patients can contact their [facility] doctors' offices to schedule an appointment  to receive the COVID-19 vaccine. Patients who do not have a [facility] physician can call [phone] to schedule vaccination appointments.      Follow Up Appointments:  Primary Care Provider:      Name: FRIZELLE,  KATHERINE SEA-MD      Phone: [phone]                 With: Address: When:   [facility] [address] , [place] [address] Within 1 week       With: Address: When:   Follow up with primary care provider  Within 1 to 2 days              Printed Prescriptions:    Patient Education Materials:  Discharge Orders          Discharge Patient 01/29/20 16:04:00 EDT         Comment:      Wound Care     Wound Care    Taking care of your wound properly can help to prevent pain and infection. It can also help your wound to heal more quickly.       HOW TO CARE FOR YOUR WOUND?     Take or  apply over-the-counter and prescription medicines only as told by your health care provider.     If you were prescribed antibiotic medicine, take or apply it as told by your health care provider. Do not stop using the antibiotic even if your condition improves.     Clean the wound each day or as told by your health care provider.    ? Wash the wound with mild soap and water.    ? Rinse the wound with water to remove all soap.    ? Pat the wound dry with a clean towel. Do not rub it.     There are many different ways to close and cover a wound. For example, a wound can be covered with stitches (sutures), skin glue, or adhesive strips. Follow instructions from your health care provider about:    ? How to take care of your wound.    ? When and how you should change your bandage (dressing).    ? When you should remove your dressing.    ? Removing whatever was used to close your wound.     Check your wound every day for signs of infection. Watch for:    ? Redness, swelling, or pain.    ? Fluid, blood, or pus.     Keep the dressing dry until your health care provider says it can be removed.  Do not take baths, swim, use a hot tub, or do anything that would put your wound underwater until your health care provider approves.     Raise (elevate) the injured area above the level of your heart while you are sitting or lying down.     Do not scratch or pick at the wound.     Keep all follow-up visits as told by your health care provider. This is important.    SEEK MEDICAL CARE IF:     You received a tetanus shot and you have swelling, severe pain, redness, or bleeding at the injection site.     You have a fever.     Your pain is not controlled with medicine.     You have increased redness, swelling, or pain at the site of your wound.     You have fluid, blood, or pus coming from your wound.     You notice a bad smell coming from your wound or your dressing.    SEEK IMMEDIATE MEDICAL CARE IF:     You have a red streak going away from your wound.    This information is not intended to replace advice given to you by your health care provider. Make sure you discuss any questions you have with your health care provider.    Document Released: 04/15/2008 Document Revised: 11/21/2014 Document Reviewed: 07/03/2014  Elsevier Interactive Patient Education ?2016 Elsevier Inc.         Allergy Info: No Known Medication Allergies     Medication Information:  [facility] provided you with a complete list of medications post discharge, if you have been instructed to stop taking a medication please ensure you also follow up with this information to your Primary Care Physician.  Unless otherwise noted, patient will continue to take medications as prescribed prior to the Emergency Room visit.  Any specific questions regarding your chronic medications and dosages should be discussed with your physician(s) and pharmacist.          Al hydroxide/Mg hydroxide/simethicone (aluminum hydroxide/magnesium hydroxide/simethicone 200 mg-200 mg-20 mg/5 mL oral suspension)  30 Milliliter Oral (given by mouth) every 3  hours as needed indigestion.  apixaban (apixaban 5 mg oral tablet) 0.5 Tabs Oral (given by mouth) 2 times a day for 30 Days. Refills: 0.  calcium-vitamin D (Caltrate 600 + D oral tablet) 1 Tabs Oral (given by mouth) 2 times a day.  clindamycin (clindamycin 300 mg oral capsule) 1 Capsules Oral (given by mouth) every 6 hours for 10 Days. Refills: 0.  denosumab  (Prolia  60 mg/mL subcutaneous solution) 1 Milliliter Subcutaneous (under the skin) every 6 months.  mupirocin topical (Bactroban 2% topical cream) 1 Application Topical (on the skin) 2 times a day for 10 Days. Refills: 1.  omeprazole (PriLOSEC) 40 Milligram Oral (given by mouth) every day.  phenyTOIN (Dilantin 100 mg oral capsule, extended release) one capule in am and two in pm.      Medications Administered During Visit:              Medication Dose Route   Sodium Chloride 0.9% 1000 mL IV Piggyback   ceftriaxone 1 g IV Piggyback          Major Tests and Procedures:  The following procedures and tests were performed during your ED visit.  COMMON PROCEDURES%>  COMMON PROCEDURES COMMENTS%>          Laboratory Orders  Name Status Details   BMP Completed Blood, Stat, ST - Stat, 01/29/20 12:41:00 EDT, 01/29/20 12:42:00 EDT, Nurse collect, KERNODLE,  NICOLE L-PA, Print label Y/N   C Blood Ordered Blood, Vein ACL, Stat, ST - Stat, 01/29/20 12:41:00 EDT, Once, 01/29/20 12:45:00 EDT, Nurse collect, 1 of 2   C Blood Ordered Blood, Arm L, Stat, ST - Stat, 01/29/20 12:41:00 EDT, Once, 01/29/20 13:05:00 EDT, Nurse collect, 2 of 2   CBCDIFF Completed Blood, Stat, ST - Stat, 01/29/20 13:20:00 EDT, 01/29/20 13:20:00 EDT, Nurse collect, Print label Y/N   Lactic Completed Blood, Stat, ST - Stat, 01/29/20 12:41:00 EDT, 01/29/20 12:42:00 EDT, Nurse collect, MARYL,  NICOLE L-PA, Print label Y/N   Irwin Carbon Completed Blood, Stat, ST - Stat, Collected, 01/29/20 13:03:00 EDT [id number], 01/29/20 13:03:00 EDT, Nurse collect, Venous Draw, 01/29/20 13:06:00 EDT, SF CP Login, KERNODLE,   NICOLE L-PA, Print label Y/N, sf_lab_accession_3, sf_lab_accession_3, 1.8 mL Blue/*[id number]...   XTube PST Completed Blood, Stat, ST - Stat, Collected, 01/29/20 13:21:00 EDT MALEAS, 01/29/20 13:21:00 EDT, Nurse collect, Venous Draw, 01/29/20 13:23:00 EDT, SF CP Login, [id number], Print label Y/N, sf_lab_accession_3, sf_lab_accession_3, 4 mL SST EDU/*[id number]*/, Complete   XTube SST Completed Blood, Stat, ST - Stat, Collected, 01/29/20 13:03:00 EDT [id number], 01/29/20 13:03:00 EDT, Nurse collect, Venous Draw, 01/29/20 13:06:00 EDT, SF CP Login, KERNODLE,  NICOLE L-PA, Print label Y/N, sf_lab_accession_3, sf_lab_accession_3, 4 mL DDU/*[id number]...               Radiology Orders  Name Status Details   XR Foot Complete Right Completed 01/29/20 12:42:00 EDT, STAT 1 hour or less, Reason: Pain, Joint, Ankle/Foot, Transport Mode: STRETCHER, pp_set_radiology_subspecialty               Patient Care Orders  Name Status Details   Discharge Patient Ordered 01/29/20 16:04:00 EDT   ED Assessment Adult Completed 01/29/20 12:04:58 EDT, 01/29/20 12:04:58 EDT   ED Secondary Triage Completed 01/29/20 12:04:58 EDT, 01/29/20 12:04:58 EDT   ED Triage Adult Completed 01/29/20 11:43:07 EDT, 01/29/20 11:43:07 EDT   Saline Lock Insert Completed 01/29/20 12:41:00 EDT, Once, 01/29/20 12:41:00 EDT   Splint Application Completed 01/29/20 15:26:00 EDT, Once, 01/29/20 15:26:00 EDT, Post Op  shoe to Affected Foot   Wound Care Treatment Order Completed 01/29/20 15:25:00 EDT, STAT 1 hour or less, left foot, laceration, normal saline, non-adherent, soak in betadine and saline. Apply Bactroban ointment. Cover with Xeroform gauze, wrap with guaze and then place in post-op shoe, 01/29/20 15:25:00 EDT, -1       ---------------------------------------------------------------------------------------------------------------------  [facility]) encourages you to self-enroll in the [facility] Patient Portal.  [facility] Patient Portal  will allow you to manage your personal health information securely from your own electronic device now and in the future.  To begin your Patient Portal enrollment process, please visit [email or web address] Click on "Sign up now" under [facility].  If you find that you need additional assistance on the [facility] Patient Portal or need a copy of your medical records, please call the [facility] Medical Records Office at [phone].  Comment:

## 2020-01-29 NOTE — Discharge Summary (Signed)
ED Clinical Summary                     [facility]  [address]  [place], [place], [address]  [phone]          PERSON INFORMATION  Name: Linda Murray, Linda Murray Age:  79 Years DOB: July 28, 1940   Sex: Female Language: English PCP: Bettey Costa SEA-MD   Marital Status: Widowed Phone: [phone] Med Service: MED-Medicine   MRN: [id number] Acct# [id number] Arrival: 01/29/2020 11:43:00   Visit Reason: Foot laceration repair; RIGHT FOOT PAIN Acuity: 3 LOS: 000 04:38   Address:    [address] [place] [address]   Diagnosis:    Foot laceration; Wound infection  Medications:          New Medications  Printed Prescriptions  clindamycin (clindamycin 300 mg oral capsule) 1 Capsules Oral (given by mouth) every 6 hours for 10 Days. Refills: 0.  Last Dose:____________________  mupirocin topical (Bactroban 2% topical cream) 1 Application Topical (on the skin) 2 times a day for 10 Days. Refills: 1.  Last Dose:____________________  Medications that have not changed  Other Medications  Al hydroxide/Mg hydroxide/simethicone (aluminum hydroxide/magnesium hydroxide/simethicone 200 mg-200 mg-20 mg/5 mL oral suspension) 30 Milliliter Oral (given by mouth) every 3 hours as needed indigestion.  Last Dose:____________________  apixaban (apixaban 5 mg oral tablet) 0.5 Tabs Oral (given by mouth) 2 times a day for 30 Days. Refills: 0.  Last Dose:____________________  calcium-vitamin D (Caltrate 600 + D oral tablet) 1 Tabs Oral (given by mouth) 2 times a day.  Last Dose:____________________  denosumab (Prolia 60 mg/mL subcutaneous solution) 1 Milliliter Subcutaneous (under the skin) every 6 months.  Last Dose:____________________  omeprazole (PriLOSEC) 40 Milligram Oral (given by mouth) every day.  Last Dose:____________________  phenyTOIN (Dilantin 100 mg oral capsule, extended release) one capule in am and two in pm.  Last Dose:____________________      Medications Administered During Visit:                 Medication Dose Route   Sodium Chloride 0.9% 1000 mL IV Piggyback   ceftriaxone 1 g IV Piggyback               Allergies      No Known Medication Allergies      Major Tests and Procedures:  The following procedures and tests were performed during your ED visit.  COMMON PROCEDURES%>  COMMON PROCEDURES COMMENTS%>                PROVIDER INFORMATION               Provider Role Assigned Enid Skeens, RNCarlyon Shadow ED Nurse 01/29/2020 12:08:51 01/29/2020 15:06:21   Zonia Kief L-PA ED MidLevel 01/29/2020 12:10:45    Teofilo Pod, RN, Alvan Dame ED Nurse 01/29/2020 14:57:46        Attending Physician:  Zonia Kief L-PA      Admit Doc  Zonia Kief L-PA     Consulting Doc       VITALS INFORMATION  Vital Sign Triage Latest   Temp Oral ORAL_1%> ORAL%>   Temp Temporal TEMPORAL_1%> TEMPORAL%>   Temp Intravascular INTRAVASCULAR_1%> INTRAVASCULAR%>   Temp Axillary AXILLARY_1%> AXILLARY%>   Temp Rectal RECTAL_1%> RECTAL%>   02 Sat 98 % 98 %   Respiratory Rate RATE_1%> RATE%>   Peripheral Pulse Rate PULSE RATE_1%> PULSE RATE%>   Apical Heart Rate HEART  RATE_1%> HEART RATE%>   Blood Pressure BLOOD PRESSURE_1%>/ BLOOD PRESSURE_1%>66 mmHg BLOOD PRESSURE%> / BLOOD PRESSURE%>76 mmHg                 Immunizations      influenza virus vaccine, inactivated  (09/15/2017)      influenza virus vaccine, inactivated  (09/16/2018)      influenza virus vaccine, inactivated  (06/09/2019)      pneumococcal 13-valent conjugate vaccine (09/11/2015)      SARS-CoV-2 PFIZER (COVID-19) vaccine  (11/11/2019)      SARS-CoV-2 PFIZER (COVID-19) vaccine  (12/02/2019)          DISCHARGE INFORMATION   Discharge Disposition: H Outpt-Sent Home   Discharge Location:  Home   Discharge Date and Time:  01/29/2020 16:21:13   ED Checkout Date and Time:  01/29/2020 16:21:13     DEPART REASON INCOMPLETE INFORMATION               Depart Action Incomplete Reason   Interactive View/I&O Recently assessed               Problems      Active           E-coli  UTI          Diarrhea          Pneumonia due to COVID-19 virus          Aphasia          Apraxia          CVA (cerebral vascular accident)              Smoking Status      Former smoker         PATIENT EDUCATION INFORMATION  Instructions:     Wound Care     Follow up:                   With: Address: When:   [facility] [address] [place], [place] [address] Within 1 week       With: Address: When:   Follow up with primary care provider  Within 1 to 2 days              ED PROVIDER DOCUMENTATION

## 2020-01-29 NOTE — ED Notes (Signed)
ED Note-Nursing       ED RN Reassessment Entered On:  01/29/2020 15:57 EDT    Performed On:  01/29/2020 15:55 EDT by Teofilo Pod, RN, Alvan Dame               ED RN Reassessment   ED RN Progress Note :   R foot soaked in betadine and NS for 10 minutes. Non-adherant dressing with Bactroban applied to R foot and wrapped. Supplies provided to family and patient for home care. Post-op shoe also applied to R foot.   Teofilo Pod RN, Alvan Dame - 01/29/2020 15:55 EDT

## 2020-01-29 NOTE — ED Provider Notes (Signed)
Ankle-Foot-Toe Pain *ED        Patient:   Linda Murray, Linda Murray             MRN: [id number]            FIN: [id number]               Age:   79 years     Sex:  Female     DOB:  Nov 29, 1940   Associated Diagnoses:   Foot laceration; Wound infection   Author:   Zonia Kief L-PA      Basic Information   Additional information: Chief Complaint from Nursing Triage Note   Chief Complaint  Chief Complaint: r foot infection with wound and cellulitis to dorsal surface pt wears a brace due r sided partial paraylsis. (01/29/20 12:00:00).      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 Patient has a care giver who did not notice the wound since the patient dresses herself. Her son at the bedside noticed the patient limping and when he examined the foot, noticed the laceration and swelling.  The onset was unknown and .Son noticed today after being out of town for 1 week..  The degree at present is severe.  The exacerbating factor is weight bearing.  The relieving factor is rest.  The location where the incident occurred was at home.  Risk factors consist of none and not diabetes mellitus.  Prior episodes: none.  Therapy today: none.  Associated symptoms: inability to bear weight, denies fever and denies chills.        Review of Systems   Constitutional symptoms:  Negative except as documented in HPI, no fever, no chills, no sweats.    Skin symptoms:  Negative except as documented in HPI.   Respiratory symptoms:  Negative except as documented in HPI.   Cardiovascular symptoms:  Negative except as documented in HPI.   Gastrointestinal symptoms:  Negative except as documented in HPI.   Neurologic symptoms:  Negative except as documented in HPI.   Endocrine symptoms:  Negative except as documented in HPI.   Allergy/immunologic symptoms:  Negative except as documented in HPI.              Additional review of systems information: All other systems reviewed and otherwise negative.      Health Status   Allergies:    Allergic Reactions (Selected)  No Known Medication Allergies.   Medications:  (Selected)   Prescriptions  Prescribed  apixaban 5 mg oral tablet: 2.5 mg, 0.5 tabs, Oral, BID, for 30 days, 30 tabs, 0 Refill(s)  Documented Medications  Documented  Caltrate 600 + D oral tablet: 1 tabs, Oral, BID, 60 tabs, 0 Refill(s)  Dilantin 100 mg oral capsule, extended release: See Instructions, one capule in am and two in pm  PriLOSEC: 40 mg, Oral, Daily, 0 Refill(s)  Prolia 60 mg/mL subcutaneous solution: 60 mg, 1 mL, Subcutaneous, q70mo, 1 mL, 0 Refill(s)  aluminum hydroxide/magnesium hydroxide/simethicone 200 mg-200 mg-20 mg/5 mL oral suspension: 30 mL, Oral, q3hr, PRN: indigestion, 0 Refill(s).      Past Medical/ Family/ Social History   Problem list:    Active Problems (10)  Aphasia   Apraxia   CVA (cerebral vascular accident)   Diarrhea   E-coli UTI   Hx of seizure disorder  Hyperlipidemia   Pneumonia due to COVID-19 virus   Shortness of breath   UI (urinary incontinence)   .      Physical Examination               Vital Signs   Vital Signs   01/29/2020 13:01 EDT Systolic Blood Pressure 118 mmHg    Diastolic Blood Pressure 68 mmHg    Temperature Oral 36.7 degC    Heart Rate Monitored 89 bpm    Respiratory Rate 17 br/min    SpO2 99 %   01/29/2020 12:00 EDT Systolic Blood Pressure 122 mmHg    Diastolic Blood Pressure 66 mmHg    Temperature Oral 36.8 degC    Heart Rate Monitored 91 bpm    Respiratory Rate 18 br/min    SpO2 98 %   .   Measurements   01/29/2020 12:04 EDT Body Mass Index est meas 17.75 kg/m2    Body Mass Index Measured 17.75 kg/m2   01/29/2020 12:00 EDT Height/Length Measured 152 cm    Weight Dosing 41 kg   .   Basic Oxygen Information   01/29/2020 13:01 EDT Oxygen Therapy Room air    SpO2 99 %   01/29/2020 12:00 EDT Oxygen Therapy Room air    SpO2 98 %   .   General:  Alert, no acute distress.     Skin:  dorsum right foot with 6cm linear open wound with 0.5cm width that is moist with serosanguineous fluid (no purulent discharge) with 0.5cm surrounding cellulitis and marked swelling dorsum foot that is flesh colored without warmth.   Cardiovascular:  S1, S2, RRR without m/r/g.    Respiratory:  Respirations are non-labored, breath sounds are equal, Symmetrical chest wall expansion, CTAB without adventitious sounds.    Musculoskeletal:  Normal ROM, normal strength, antalgic gait secondary to right foot pain with minimal weight bearing.    Neurological:  Alert and oriented to person, place, time, and situation, No focal neurological deficit observed, normal sensory observed.       Medical Decision Making   Documents reviewed:  Emergency department nurses' notes, emergency department records.    Radiology results:  Rad Results (ST)   XR Foot Complete Right  ?  01/29/20 14:07:38  Right foot AP, lateral, and oblique: 01/29/20    INDICATION:Pain, Joint, Ankle/Foot.    COMPARISON: None.    FINDINGS:    Diffusely decreased bone mineral density.    No acute fracture or dislocation. Mild degenerative changes of the first MTP  joint. No radiopaque foreign body. Marked soft tissue swelling over the dorsal  aspect of the foot. No subcutaneous gas. No periosteal reaction    IMPRESSION:  Marked soft tissue swelling over the dorsal aspect of the foot. No subcutaneous  gas. No periosteal reaction  ?  Signed By: Sharmon Leyden J-MD  .   Notes:  To begin antibiotics - no fracture or signs of gangrene. Patient to begin wound management and f/u w/PMD closely. She has been afebrile while in ER and is stable for d/c non-toxic appearing..      Reexamination/ Reevaluation   Vital signs   Basic Oxygen Information   01/29/2020 13:01 EDT Oxygen Therapy Room air    SpO2 99 %   01/29/2020 12:00 EDT Oxygen Therapy Room air    SpO2 98 %         Impression and Plan   Diagnosis   Foot laceration (ICD10-CM S91.319A, Discharge, Medical)   Wound  infection (ICD10-CM T14.8XXA,  Discharge, Medical)   Plan   Condition: Improved, Stable.    Disposition: Discharged: Time  01/29/2020 16:02:00, to home.    Prescriptions: Launch prescriptions   Pharmacy:  clindamycin 300 mg oral capsule (Prescribe): 300 mg, 1 caps, Oral, q6hr, for 10 days, 40 caps, 0 Refill(s), Launch prescriptions   Pharmacy:  Bactroban 2% topical cream (Prescribe): 1 app, Topical, BID, for 10 days, 30 g, 1 Refill(s).    Patient was given the following educational materials: Wound Care, Wound Care, Wound Care.    Follow up with: Follow up with primary care provider Within 1 to 2 days, Follow up with primary care provider Within 1 to 2 days; [facility] Within 1 week.    Counseled: Patient, Family, Regarding diagnosis, Regarding diagnostic results, Regarding treatment plan, Regarding prescription, Patient indicated understanding of instructions.    Signature Line     Electronically Signed on 01/31/2020 04:37 AM EDT   ________________________________________________   Zonia Kief L-PA      Electronically Signed on 02/02/2020 06:29 AM EDT   ________________________________________________   Yolanda Manges            Modified by: Zonia Kief L-PA on 01/29/2020 04:04 PM EDT      Modified by: Gavin Potters,  NICOLE L-PA on 01/29/2020 04:04 PM EDT      Modified by: Gavin Potters,  NICOLE L-PA on 01/31/2020 04:35 AM EDT      Modified by: Gavin Potters,  NICOLE L-PA on 01/31/2020 04:37 AM EDT

## 2020-01-29 NOTE — ED Notes (Signed)
ED Patient Education Note     Patient Education Materials Follows:  Dermatology     Wound Care    Taking care of your wound properly can help to prevent pain and infection. It can also help your wound to heal more quickly.       HOW TO CARE FOR YOUR WOUND?     Take or apply over-the-counter and prescription medicines only as told by your health care provider.     If you were prescribed antibiotic medicine, take or apply it as told by your health care provider. Do not stop using the antibiotic even if your condition improves.     Clean the wound each day or as told by your health care provider.    ? Wash the wound with mild soap and water.    ? Rinse the wound with water to remove all soap.    ? Pat the wound dry with a clean towel. Do not rub it.     There are many different ways to close and cover a wound. For example, a wound can be covered with stitches (sutures), skin glue, or adhesive strips. Follow instructions from your health care provider about:    ? How to take care of your wound.    ? When and how you should change your bandage (dressing).    ? When you should remove your dressing.    ? Removing whatever was used to close your wound.     Check your wound every day for signs of infection. Watch for:    ? Redness, swelling, or pain.    ? Fluid, blood, or pus.     Keep the dressing dry until your health care provider says it can be removed. Do not take baths, swim, use a hot tub, or do anything that would put your wound underwater until your health care provider approves.     Raise (elevate) the injured area above the level of your heart while you are sitting or lying down.     Do not scratch or pick at the wound.     Keep all follow-up visits as told by your health care provider. This is important.    SEEK MEDICAL CARE IF:     You received a tetanus shot and you have swelling, severe pain, redness, or bleeding at the injection site.     You have a fever.     Your pain is not controlled with medicine.      You have increased redness, swelling, or pain at the site of your wound.     You have fluid, blood, or pus coming from your wound.     You notice a bad smell coming from your wound or your dressing.    SEEK IMMEDIATE MEDICAL CARE IF:     You have a red streak going away from your wound.    This information is not intended to replace advice given to you by your health care provider. Make sure you discuss any questions you have with your health care provider.    Document Released: 04/15/2008 Document Revised: 11/21/2014 Document Reviewed: 07/03/2014  Elsevier Interactive Patient Education ?2016 [employer]

## 2020-01-29 NOTE — ED Notes (Signed)
ED Triage Note       ED Triage Adult Entered On:  01/29/2020 12:04 EDT    Performed On:  01/29/2020 12:00 EDT by BELLEW, RN, KRISTY M               Triage   Numeric Rating Pain Scale :   5 = Moderate pain   Chief Complaint :   r foot infection with wound and cellulitis to dorsal surface pt wears a brace due r sided partial paraylsis.   [place] Mode of Arrival :   Wheelchair   Infectious Disease Documentation :   Document assessment   Patient received chemo or biotherapy last 48 hrs? :   No   Temperature Oral :   36.8 degC(Converted to: 98.2 degF)    Heart Rate Monitored :   91 bpm   Respiratory Rate :   18 br/min   Systolic Blood Pressure :   122 mmHg   Diastolic Blood Pressure :   66 mmHg   SpO2 :   98 %   Oxygen Therapy :   Room air   Patient presentation :   None of the above   Chief Complaint or Presentation suggest infection :   No   Weight Dosing :   41 kg(Converted to: 90 lb 6 oz)    Height :   152 cm(Converted to: 5 ft 0 in)    Body Mass Index Dosing :   18 kg/m2   BELLEW, RN, KRISTY M - 01/29/2020 12:00 EDT   DCP GENERIC CODE   Tracking Acuity :   3   Tracking Group :   ED [id number] [employer]acking [employer]   [place], RN, Soyla Dryer - 01/29/2020 12:00 EDT   ED General Section :   Document assessment   Pregnancy Status :   N/A   ED Allergies Section :   Document assessment   ED Reason for Visit Section :   Document assessment   BELLEW, RN, KRISTY M - 01/29/2020 12:00 EDT   ID Risk Screen Symptoms   Recent Travel History :   No recent travel   Close Contact with COVID-19 ID :   No   Last 14 days COVID-19 ID :   No   BELLEW, RN, KRISTY M - 01/29/2020 12:00 EDT   Allergies   (As Of: 01/29/2020 12:04:58 EDT)   Allergies (Active)   No Known Medication Allergies  Estimated Onset Date:   Unspecified ; Created ByHomero Fellers, RN, AMANDA L; Reaction Status:   Active ; Category:   Drug ; Substance:   No Known Medication Allergies ; Type:   Allergy ; Updated By:   Homero Fellers, RN, Jill Side; Reviewed Date:   08/11/2019 16:08 EST        Psycho-Social    Last 3 mo, thoughts killing self/others :   Patient denies   Right click within box for Suspected Abuse policy link. :   None   Feels Safe Where Live :   Yes   BELLEW, RN, KRISTY M - 01/29/2020 12:00 EDT   ED Reason for Visit   (As Of: 01/29/2020 12:04:58 EDT)   Problems(Active)    Aphasia (IMO  :161096 )  Name of Problem:   Aphasia ; Recorder:   CABE, RN, ADAM Z; Confirmation:   Confirmed ; Classification:   Medical ; Code:   [id number] ; Contributor System:   Dietitian ; Last Updated:   01/22/2016 18:29 EDT ; Life Cycle Date:  01/22/2016 ; Life Cycle Status:   Active ; Vocabulary:   IMO        Apraxia (IMO  :G166641 )  Name of Problem:   Apraxia ; Recorder:   CABE, RN, ADAM Z; Confirmation:   Confirmed ; Classification:   Medical ; Code:   G166641 ; Contributor System:   Dietitian ; Last Updated:   01/22/2016 18:29 EDT ; Life Cycle Date:   01/22/2016 ; Life Cycle Status:   Active ; Vocabulary:   IMO        CVA (cerebral vascular accident) (IMO  :161096 )  Name of Problem:   CVA (cerebral vascular accident) ; Recorder:   Homero Fellers, RN, AMANDA L; Confirmation:   Confirmed ; Classification:   Medical ; Code:   [phone] ; Contributor System:   Dietitian ; Last Updated:   07/20/2015 19:30 EST ; Life Cycle Date:   07/20/2015 ; Life Cycle Status:   Active ; Vocabulary:   IMO        Diarrhea (SNOMED CT  :811914782 )  Name of Problem:   Diarrhea ; Recorder:   Janee Morn, MD, Madlyn Frankel; Confirmation:   Confirmed ; Classification:   Medical ; Code:   [id number] ; Contributor System:   Dietitian ; Last Updated:   08/22/2019 12:48 EST ; Life Cycle Status:   Active ; Responsible Provider:   Janee Morn, MD, Madlyn Frankel; Vocabulary:   SNOMED CT        E-coli UTI (SNOMED CT  :578469629 )  Name of Problem:   E-coli UTI ; Recorder:   Janee Morn, MD, Madlyn Frankel; Confirmation:   Confirmed ; Classification:   Medical ; Code:   528413244 ; Contributor System:   Dietitian ; Last Updated:   08/22/2019 12:50 EST ; Life Cycle Status:   Active ; Responsible  Provider:   Janee Morn, MD, Madlyn Frankel; Vocabulary:   SNOMED CT        Hx of seizure disorder (SNOMED CT  :010272536 )  Name of Problem:   Hx of seizure disorder ; Recorder:   DORKEWITZ, RN, BRIDGET A; Confirmation:   Confirmed ; Classification:   Patient Stated ; Code:   644034742 ; Contributor System:   PowerChart ; Last Updated:   10/22/2017 11:25 EDT ; Life Cycle Date:   10/22/2017 ; Life Cycle Status:   Active ; Vocabulary:   SNOMED CT        Hyperlipidemia (SNOMED CT  :59563875 )  Name of Problem:   Hyperlipidemia ; Recorder:   DORKEWITZ, RN, BRIDGET A; Confirmation:   Confirmed ; Classification:   Patient Stated ; Code:   64332951 ; Contributor System:   PowerChart ; Last Updated:   10/22/2017 11:24 EDT ; Life Cycle Date:   10/22/2017 ; Life Cycle Status:   Active ; Vocabulary:   SNOMED CT        Pneumonia due to COVID-19 virus (SNOMED CT  :8841660630 )  Name of Problem:   Pneumonia due to COVID-19 virus ; Recorder:   Janee Morn, MD, Madlyn Frankel; Confirmation:   Confirmed ; Classification:   Medical ; Code:   1601093235 ; Contributor System:   PowerChart ; Last Updated:   08/22/2019 12:44 EST ; Life Cycle Status:   Active ; Responsible Provider:   Janee Morn, MD, Madlyn Frankel; Vocabulary:   SNOMED CT        Shortness of breath (IMO  :57322 )  Name of Problem:   Shortness of breath ; Recorder:   SYSTEM,  SYSTEM; Confirmation:  Confirmed ; Classification:   Patient Stated ; Code:   16109 ; Last Updated:   08/11/2019 11:54 EST ; Life Cycle Date:   08/11/2019 ; Life Cycle Status:   Active ; Vocabulary:   IMO        UI (urinary incontinence) (SNOMED CT  :6045409811 )  Name of Problem:   UI (urinary incontinence) ; Recorder:   DORKEWITZ, RN, BRIDGET A; Confirmation:   Confirmed ; Classification:   Patient Stated ; Code:   [id number] ; Contributor System:   PowerChart ; Last Updated:   10/22/2017 11:25 EDT ; Life Cycle Date:   10/22/2017 ; Life Cycle Status:   Active ; Vocabulary:   SNOMED CT          Diagnoses(Active)    Foot laceration  repair  Date:   01/29/2020 ; Diagnosis Type:   Reason For Visit ; Confirmation:   Complaint of ; Clinical Dx:   Foot laceration repair ; Classification:   Medical ; Clinical Service:   Emergency medicine ; Code:   PNED ; Probability:   0 ; Diagnosis Code:   [id number]

## 2020-01-29 NOTE — ED Notes (Signed)
ED Triage Note       ED Secondary Triage Entered On:  01/29/2020 13:23 EDT    Performed On:  01/29/2020 12:30 EDT by Lytle Michaels, RN, ASHLEY C               General Information   Barriers to Learning :   None evident   Languages :   English   Last Tetanus :   Less than 5 years   Immunizations Current :   Yes   COVID-19 Vaccine Status :   2 Doses received   ED Home Meds Section :   Document assessment   [facility] ED Fall Risk Section :   Document assessment   ED History Section :   Document assessment   ED Advance Directives Section :   Document assessment   ED Palliative Screen :   Document assessment   Lytle Michaels, RN, ASHLEY C - 01/29/2020 13:22 EDT   (As Of: 01/29/2020 13:23:10 EDT)   Problems(Active)    Aphasia (IMO  :458099 )  Name of Problem:   Aphasia ; Recorder:   CABE, RN, ADAM Z; Confirmation:   Confirmed ; Classification:   Medical ; Code:   [id number] ; Contributor System:   Dietitian ; Last Updated:   01/22/2016 18:29 EDT ; Life Cycle Date:   01/22/2016 ; Life Cycle Status:   Active ; Vocabulary:   IMO        Apraxia (IMO  :G166641 )  Name of Problem:   Apraxia ; Recorder:   CABE, RN, ADAM Z; Confirmation:   Confirmed ; Classification:   Medical ; Code:   G166641 ; Contributor System:   Dietitian ; Last Updated:   01/22/2016 18:29 EDT ; Life Cycle Date:   01/22/2016 ; Life Cycle Status:   Active ; Vocabulary:   IMO        CVA (cerebral vascular accident) (IMO  :833825 )  Name of Problem:   CVA (cerebral vascular accident) ; Recorder:   Homero Fellers, RN, AMANDA L; Confirmation:   Confirmed ; Classification:   Medical ; Code:   [id number] ; Contributor System:   Dietitian ; Last Updated:   07/20/2015 19:30 EST ; Life Cycle Date:   07/20/2015 ; Life Cycle Status:   Active ; Vocabulary:   IMO        Diarrhea (SNOMED CT  :734193790 )  Name of Problem:   Diarrhea ; Recorder:   Janee Morn, MD, Madlyn Frankel; Confirmation:   Confirmed ; Classification:   Medical ; Code:   240973532 ; Contributor System:   Dietitian ; Last Updated:   08/22/2019 12:48  EST ; Life Cycle Status:   Active ; Responsible Provider:   Janee Morn, MD, Madlyn Frankel; Vocabulary:   SNOMED CT        E-coli UTI (SNOMED CT  :992426834 )  Name of Problem:   E-coli UTI ; Recorder:   Janee Morn, MD, Madlyn Frankel; Confirmation:   Confirmed ; Classification:   Medical ; Code:   196222979 ; Contributor System:   Dietitian ; Last Updated:   08/22/2019 12:50 EST ; Life Cycle Status:   Active ; Responsible Provider:   Janee Morn, MD, Madlyn Frankel; Vocabulary:   SNOMED CT        Hx of seizure disorder (SNOMED CT  :892119417 )  Name of Problem:   Hx of seizure disorder ; Recorder:   DORKEWITZ, RN, BRIDGET A; Confirmation:   Confirmed ; Classification:   Patient Stated ; Code:   408144818 ;  Contributor System:   Dietitian ; Last Updated:   10/22/2017 11:25 EDT ; Life Cycle Date:   10/22/2017 ; Life Cycle Status:   Active ; Vocabulary:   SNOMED CT        Hyperlipidemia (SNOMED CT  :00938182 )  Name of Problem:   Hyperlipidemia ; Recorder:   DORKEWITZ, RN, BRIDGET A; Confirmation:   Confirmed ; Classification:   Patient Stated ; Code:   99371696 ; Contributor System:   PowerChart ; Last Updated:   10/22/2017 11:24 EDT ; Life Cycle Date:   10/22/2017 ; Life Cycle Status:   Active ; Vocabulary:   SNOMED CT        Pneumonia due to COVID-19 virus (SNOMED CT  :7893810175 )  Name of Problem:   Pneumonia due to COVID-19 virus ; Recorder:   Janee Morn, MD, Madlyn Frankel; Confirmation:   Confirmed ; Classification:   Medical ; Code:   1025852778 ; Contributor System:   PowerChart ; Last Updated:   08/22/2019 12:44 EST ; Life Cycle Status:   Active ; Responsible Provider:   Janee Morn, MD, Madlyn Frankel; Vocabulary:   SNOMED CT        Shortness of breath (IMO  :24235 )  Name of Problem:   Shortness of breath ; Recorder:   SYSTEM,  SYSTEM; Confirmation:   Confirmed ; Classification:   Patient Stated ; Code:   36144 ; Last Updated:   08/11/2019 11:54 EST ; Life Cycle Date:   08/11/2019 ; Life Cycle Status:   Active ; Vocabulary:   IMO        UI (urinary  incontinence) (SNOMED CT  :3154008676 )  Name of Problem:   UI (urinary incontinence) ; Recorder:   DORKEWITZ, RN, BRIDGET A; Confirmation:   Confirmed ; Classification:   Patient Stated ; Code:   1950932671 ; Contributor System:   PowerChart ; Last Updated:   10/22/2017 11:25 EDT ; Life Cycle Date:   10/22/2017 ; Life Cycle Status:   Active ; Vocabulary:   SNOMED CT          Diagnoses(Active)    Foot laceration repair  Date:   01/29/2020 ; Diagnosis Type:   Reason For Visit ; Confirmation:   Complaint of ; Clinical Dx:   Foot laceration repair ; Classification:   Medical ; Clinical Service:   Emergency medicine ; Code:   PNED ; Probability:   0 ; Diagnosis Code:   [id number] F7             -    Procedure History   (As Of: 01/29/2020 13:23:10 EDT)     Procedure Dt/Tm:   01/22/2016 21:00:00 EDT ; Location:   [facility] ; Provider:   Laren Boom; Anesthesia Type:   Monitored Anesthesia Care ; :   Helyn Numbers,  JEFFREY DEAN; Anesthesia Minutes:   0 ; Procedure Name:   Esophagastroduodenoscopy with Removal Foreign Body ; Procedure Minutes:   6 ; Comments:     01/22/2016 21:27 EDT - Urbano Heir, RN, DONNA J  auto-populated from documented surgical case ; Clinical Service:   Surgery            Anesthesia Minutes:   0 ; Procedure Name:   Brain ; Procedure Minutes:   0 ; Comments:     10/22/2017 11:26 EDT - Butch Penny, RN, BRIDGET A  surgery 04/1984            [facility] Fall Risk Assessment Tool   Hx of falling last 3  months ED Fall :   No   Patient confused or disoriented ED Fall :   No   Patient intoxicated or sedated ED Fall :   No   Patient impaired gait ED Fall :   No   Use a mobility assistance device ED Fall :   No   Patient altered elimination ED Fall :   No   [facility] ED Fall Score :   0    MALECKI, RN, Carlyon Shadow - 01/29/2020 13:22 EDT   ED Advance Directive   Advance Directive :   No   MALECKI, RN, ASHLEY C - 01/29/2020 13:22 EDT   Palliative Care   Does the Patient have a Life Limiting Illness :    None of the above   MALECKI, RN, ASHLEY C - 01/29/2020 13:22 EDT   Social History   Social History   (As Of: 01/29/2020 13:23:10 EDT)   Tobacco:        Former smoker, Cigarettes   (Last Updated: 01/22/2016 18:29:32 EDT by Loura Pardon, RN, ADAM Z)          Alcohol:        Denies   (Last Updated: 10/22/2017 11:28:05 EDT by Butch Penny, RN, BRIDGET A)            Med Hx   Medication List   (As Of: 01/29/2020 13:23:10 EDT)   Normal Order    cefTRIAXone  :   cefTRIAXone ; Status:   Completed ; Ordered As Mnemonic:   cefTRIAXone ( Rocephin ) ; Simple Display Line:   1 g, IV Piggyback, Once ; Ordering Provider:   Gavin Potters,  NICOLE L-PA; Catalog Code:   cefTRIAXone ; Order Dt/Tm:   01/29/2020 12:41:29 EDT          Sodium Chloride 0.9% intravenous solution Bolus  :   Sodium Chloride 0.9% intravenous solution Bolus ; Status:   Completed ; Ordered As Mnemonic:   Sodium Chloride 0.9% bolus ; Simple Display Line:   1,000 mL, 2000 mL/hr, IV Piggyback, Once ; Ordering Provider:   Gavin Potters,  NICOLE L-PA; Catalog Code:   Sodium Chloride 0.9% ; Order Dt/Tm:   01/29/2020 12:41:27 EDT            Prescription/Discharge Order    apixaban  :   apixaban ; Status:   Prescribed ; Ordered As Mnemonic:   apixaban 5 mg oral tablet ; Simple Display Line:   2.5 mg, 0.5 tabs, Oral, BID, for 30 days, 30 tabs, 0 Refill(s) ; Ordering Provider:   Janee Morn, MD, Madlyn Frankel; Catalog Code:   apixaban ; Order Dt/Tm:   08/24/2019 11:43:36 EST            Home Meds    Al hydroxide/Mg hydroxide/simethicone  :   Al hydroxide/Mg hydroxide/simethicone ; Status:   Documented ; Ordered As Mnemonic:   aluminum hydroxide/magnesium hydroxide/simethicone 200 mg-200 mg-20 mg/5 mL oral suspension ; Simple Display Line:   30 mL, Oral, q3hr, PRN: indigestion, 0 Refill(s) ; Ordering Provider:   Janee Morn, MD, Madlyn Frankel; Catalog Code:   Al hydroxide/Mg hydroxide/simethicone ; Order Dt/Tm:   08/24/2019 11:33:58 EST          calcium-vitamin D  :   calcium-vitamin D ; Status:   Documented ; Ordered  As Mnemonic:   Caltrate 600 + D oral tablet ; Simple Display Line:   1 tabs, Oral, BID, 60 tabs, 0 Refill(s) ; Catalog Code:   calcium-vitamin D ; Order Dt/Tm:   10/22/2017  11:51:11 EDT          omeprazole  :   omeprazole ; Status:   Documented ; Ordered As Mnemonic:   PriLOSEC ; Simple Display Line:   40 mg, Oral, Daily, 0 Refill(s) ; Catalog Code:   omeprazole ; Order Dt/Tm:   10/22/2017 11:50:56 EDT          phenyTOIN  :   phenyTOIN ; Status:   Documented ; Ordered As Mnemonic:   Dilantin 100 mg oral capsule, extended release ; Simple Display Line:   See Instructions, one capule in am and two in pm ; Ordering Provider:   Pasty SpillersFrizelle, MD, Philmore PaliKatherine S; Catalog Code:   phenyTOIN ; Order Dt/Tm:   10/13/2015 13:55:06 EDT          denosumab  :   denosumab ; Status:   Documented ; Ordered As Mnemonic:   Prolia 60 mg/mL subcutaneous solution ; Simple Display Line:   60 mg, 1 mL, Subcutaneous, q7660mo, 1 mL, 0 Refill(s) ; Catalog Code:   denosumab ; Order Dt/Tm:   10/13/2015 13:54:44 EDT

## 2020-02-03 LAB — COMPREHENSIVE METABOLIC PANEL
ALT: 15 U/L (ref 0–33)
AST: 18 U/L (ref 0–32)
Albumin/Globulin Ratio: 1.5 mmol/L (ref 1.00–2.00)
Albumin: 4.1 g/dL (ref 3.5–5.2)
Alk Phosphatase: 98 U/L (ref 35–117)
Anion Gap: 8 mmol/L (ref 2–17)
BUN: 25 mg/dL — ABNORMAL HIGH (ref 8–23)
CO2: 23 mmol/L (ref 22–29)
Calcium: 9.2 mg/dL (ref 8.8–10.2)
Chloride: 109 mmol/L — ABNORMAL HIGH (ref 98–107)
Creatinine: 0.7 mg/dL (ref 0.5–1.0)
GFR African American: 96 mL/min/{1.73_m2} (ref >=90)
GFR Non-African American: 82 mL/min/{1.73_m2} — ABNORMAL LOW (ref >=90)
Globulin: 3 g/dL (ref 1.9–4.4)
Glucose: 87 mg/dL (ref 70–99)
OSMOLALITY CALCULATED: 283 mOsm/kg (ref 270–287)
Potassium: 5 mmol/L (ref 3.5–5.3)
Sodium: 140 mmol/L (ref 135–145)
Total Bilirubin: 0.2 mg/dL (ref 0.00–1.20)
Total Protein: 6.8 g/dL (ref 6.4–8.3)

## 2020-02-03 LAB — CULTURE, BLOOD 1

## 2020-02-03 LAB — PTH, INTACT: Pth Intact: 104.5 pg/mL — ABNORMAL HIGH (ref 15.00–65.00)

## 2020-02-03 LAB — VITAMIN D 25 HYDROXY: Vit D, 25-Hydroxy: 49.4 ng/mL (ref 30.0–90.0)

## 2020-02-03 LAB — MAGNESIUM: Magnesium: 2.4 mg/dL (ref 1.6–2.6)

## 2020-02-07 LAB — ELECTROPHORESIS PROTEIN, SERUM WITH REFLEX TO IMMUNOFIXATION
Albumin Electrophoresis: 3.5 g/dL (ref 2.9–4.4)
Albumin/Globulin Ratio: 1.1 (ref 0.7–1.7)
Alpha 1: 0.3 g/dL (ref 0.0–0.4)
Alpha 2: 0.7 g/dL (ref 0.4–1.0)
Beta Globulin: 0.7 g/dL (ref 0.7–1.3)
Gamma Globulin: 1.4 g/dL (ref 0.4–1.8)
Globulin: 3.2 g/dL (ref 2.2–3.9)
Total Protein: 6.7 g/dL (ref 6.0–8.5)

## 2020-12-20 LAB — DILANTIN: Total Phenytoin: 10.1 ug/mL (ref 10.0–20.0)

## 2020-12-20 LAB — CALCIUM IONIZED SERUM: Calcium, Ionized: 5.2 mg/dL (ref 4.5–5.3)

## 2020-12-20 LAB — VITAMIN D 25 HYDROXY: Vit D, 25-Hydroxy: 59.7 ng/mL (ref 30.0–90.0)

## 2020-12-20 LAB — PTH, INTACT: Pth Intact: 54 pg/mL (ref 15.00–65.00)

## 2021-02-23 NOTE — Telephone Encounter (Signed)
-----   Message from Kara Pacer, [place] sent at 02/05/2021  9:42 PM EDT -----  Regarding: 2021/08/13 apt Prolia x 3; Dexa 12/07/19  Refer: Pasty Spillers, on Prolia 12/07/19. Not Transfer of care. On Dilantin. Started prolia in october 2019? Missed October 2020 dose.   DEXA: worsening 12/07/19  Labs: ordered  Prolia: NO PA REQ; Subject to $203 ded & 20% coins  Mackay Hanauer B, PA 12/07/2019 03:56:00 PM EDT > not transfer of care (still getting her Prolia at [place] office)- consult about worsening BMD, labs ordered, can schedule, run Cecelia Byars  12/22/2019 09:46:58 AM EDT > app[place] 02/14/20 [place]; spoke to pt's son Rob he will bring mother to this appt. he stated his mother is disabled  Hessie Diener  01/14/2021 10:19:35 AM > Rob r/s pt's appt to 7/[place]/22[place] for prolia inj

## 2021-02-28 NOTE — Telephone Encounter (Signed)
Pt son Molly Maduro is calling in regards to DME for a wheelchair. Patient has not recevied the wheelchair. Please call Rob back   [phone]

## 2021-03-01 NOTE — Telephone Encounter (Signed)
Spoke with rob and informed him that I spoke with [employer] and they never received OV notes, informed pts son that I will keep an eye on this and follow up with him

## 2021-03-12 NOTE — Telephone Encounter (Signed)
[employer] called stating that they will need an addendum added to chart note in order for wheelchair to be approved ppw has been placed on desk, please advise

## 2021-03-12 NOTE — Telephone Encounter (Signed)
FAXED

## 2021-06-05 NOTE — Telephone Encounter (Signed)
Patient son is requesting a callback in regards to getting a wheelchair for the patient, says he has been calling and nothing is getting done, please follow up

## 2021-06-05 NOTE — Telephone Encounter (Signed)
Pt never got wheelchair from, pt son is not satified with [employer]. new prescription written I will send to a different DME comppany

## 2021-08-13 ENCOUNTER — Encounter: Admit: 2021-08-13 | Discharge: 2021-08-13 | Payer: MEDICARE | Attending: Medical | Primary: Family Medicine

## 2021-08-13 DIAGNOSIS — M810 Age-related osteoporosis without current pathological fracture: Secondary | ICD-10-CM

## 2021-08-13 MED ORDER — DENOSUMAB 60 MG/ML SC SOSY
60 MG/ML | Freq: Once | SUBCUTANEOUS | Status: AC
Start: 2021-08-13 — End: 2021-08-13
  Administered 2021-08-13: 16:00:00 60 mg via SUBCUTANEOUS

## 2021-08-13 NOTE — Progress Notes (Signed)
[facility]   Established Patient Note  Roney Jaffe, PA-C    HISTORY  Linda Murray is an 81 y.o. female seen for continued osteoporosis management.    Falls since last visit:  Denies  Fractures since last visit: Denies  Medication adverse effects:  Denies  Taking calcium and vitamin D: Admits  Weight bearing exercise: Admits    RELEVANT REVIEW OF SYSTEMS IS NOTED IN THE HPI    No Known Allergies    Current Outpatient Medications   Medication Sig Dispense Refill    phenytoin (DILANTIN) 100 MG ER capsule 1 capsule       Current Facility-Administered Medications   Medication Dose Route Frequency Provider Last Rate Last Admin    denosumab (PROLIA) SC injection 60 mg  60 mg SubCUTAneous Once Kara Pacer, PA           No past medical history on file.     No past surgical history on file.    Social History     Socioeconomic History    Marital status: Widowed     Spouse name: Not on file    Number of children: Not on file    Years of education: Not on file    Highest education level: Not on file   Occupational History    Not on file   Tobacco Use    Smoking status: Never     Passive exposure: Never    Smokeless tobacco: Never   Substance and Sexual Activity    Alcohol use: Never    Drug use: Never    Sexual activity: Not on file   Other Topics Concern    Not on file   Social History Narrative    Not on file     Social Determinants of Health     Financial Resource Strain: Not on file   Food Insecurity: Not on file   Transportation Needs: Not on file   Physical Activity: Not on file   Stress: Not on file   Social Connections: Not on file   Intimate Partner Violence: Not on file   Housing Stability: Not on file        VITALS  Wt Readings from Last 1 Encounters:   08/13/21 101 lb (45.8 kg)     Ht Readings from Last 1 Encounters:   08/13/21 5\' 2"  (1.575 m)       PHYSICAL EXAM  Constitutional: well-groomed, no acute distress, Body mass index is 18.47 kg/m??.  Psychiatric: normal  affect, oriented to place, person, and situation  Respiratory: respirations unlabored, on room air; mask on  Skin: no rashes, wounds, or lesions seen on exposed skin    LABS  Lab Results   Component Value Date    BUN 25 (H) 02/03/2020    CREATININE 0.7 02/03/2020    CALCIUM 9.2 02/03/2020    LABALBU 4.1 02/03/2020    ALKPHOS 98 02/03/2020    LABGLOM 82 (L) 02/03/2020    GFRAA 96 02/03/2020       ASSESSMENT    ICD-10-CM    1. Age-related osteoporosis without current pathological fracture  M81.0 denosumab (PROLIA) SC injection 60 mg     DEXA BONE DENSITY AXIAL SKELETON     Basic Metabolic Panel     Vitamin D 25 Hydroxy     PTH, Intact          PLAN  Osteoporosis ( femoral neck, total hip)  Osteopenia ( spine)  DEXA (  12/07/19)  Fragility fracture ( none)  Fall risk ( high, but is now in a wheelchair)  Prolia (started 2017)    DEXA from 2021, 2019, and 2017 were reviewed. These are listed from least to most recent.  Lumbar T score of -0.7, -0.7, -1.2  Femoral neck T score of -3.3, -3.5 (right hip), -2.6 (left hip)  Total hip T score of -3.3, -3.5 (right hip), -3.0 (left hip).    A few noted items about why she may not be seeing improvement: She had skipped two doses of Prolia, she has become wheelchair bound (disuse atrophy), and she has significant hyperparathyroidism. We have no comparison in the hip, but assuming symmetry, she is trending in the right direction.    Recent DEXA was re-reviewed and the patient is up to date, or the DEXA was ordered if due.  Any applicable recent labs reviewed. Calcium within one year is normal, or has been corrected since the lab draw if it was low.  10 minutes was spent in the insurance verification for Prolia, and prior authorizations have been obtained if required.  Continue calcium and vitamin D daily from diet and/or supplement.   Continue weight bearing and core strengthening exercises to target goal of 150 minutes per week to improve bone density and reduce falls.  The skin was  prepped with alcohol and Prolia was given subcutaneously without complication.   Advised patient to make me aware of any new falls or fractures that occur before the next scheduled visit.   Follow up in 6 months for next Prolia.             -Kara Pacer, PA-C  Program Director  [facility]

## 2021-09-16 NOTE — Telephone Encounter (Signed)
Spoke with patients son, he stated he never heard anything back regarding the patients wheelchair. He would like a call back at [phone] to see who he needs to call.

## 2021-09-17 NOTE — Telephone Encounter (Signed)
Called [facility] they stated they never received the order, however it was faxed to them in November 2022. New order sent. Also spoke to pt son and informed him of what is going on.

## 2021-12-05 ENCOUNTER — Encounter: Attending: Family Medicine | Primary: Family Medicine

## 2021-12-09 NOTE — Progress Notes (Signed)
Medicare Annual Wellness Visit    Linda Murray is here for Other (RIGHT LEG/FACE SKIN IRRITATION /POSSIBLE TOENAIL FUNGUS ) and Medicare AWV    Assessment & Plan   Medicare annual wellness visit, subsequent  Need for Tdap vaccination  Comments:  Advised she can get at pharmacy  Need for shingles vaccine  Comments:  Advise she can get at pharmacy  Need for COVID-19 vaccine  -     COVID-19, PFIZER Bivalent BOOSTER, DO NOT Dilute, (age 81y+), IM, 30 mcg/0.3 mL  Encounter for immunization  -     Influenza, FLUZONE HIGH-DOSE, (age 81 y+), IM, Preservative Free, 0.7 mL  Skin rash  Comments:  Looks related to friction from brace.  Potentially fungal.  Prescribed combination of steroid and fungal cream.  Advised to let "air out" when possible  Basal cell carcinoma (BCC), unspecified site  Comments:  Being managed and treated by dermatology    Recommendations for Preventive Services Due: see orders and patient instructions/AVS.  Recommended screening schedule for the next 5-10 years is provided to the patient in written form: see Patient Instructions/AVS.     Return in 1 year (on 12/13/2022).     Subjective         Health Maintenance:  Flu: Date of Last just got a flu shot   Pneumovax Date of Last 08/04/05 DUE   Prevnar13 Date of Last 09/11/15  Tdap Date of Last TD 12/12/09, due but doesn't want today   Shingrix: Zostavax, advised to invesigate at pharmacy  COVID-19 Immunization Vaccination COMPLETED PFIZER 11/30/20  Cardiovascular Screening Date of Last DUE   Diabetes Screening Date of Last DUE   Pap/Pelvic Date of Last >65  Colorectal Cancer Screening >75  Mammography Date of Last  >75  Bone Density Date of Last 12/07/19  Bone Density Date Due DUE/ORDERED BY KATHRYN CONNER,PA  STD screening  Hepatitis C Screening Date of Last NOT AGE REQUIRED   Lung Cancer n/a    2. Seizure disorder - Was taken on Dilantin ER after seeing a neurologist  4. Osteoporosis -  continue prolia per our osteoporosis clinic        Had a basal cell  carcinoma removed. They wanted to do radiation but she declined. Also declined surgery. Has been irritated. Has a nurse checking on this at the the facility at which she resides.   Beside this wound has an itchy area that hs been bothering her. Wears a brace       Patient's complete Health Risk Assessment and screening values have been reviewed and are found in Flowsheets. The following problems were reviewed today and where indicated follow up appointments were made and/or referrals ordered.    Positive Risk Factor Screenings with Interventions:                    Vision Screen:  Do you have difficulty driving, watching TV, or doing any of your daily activities because of your eyesight?: No  Have you had an eye exam within the past year?: (!) No  No results found.    Interventions:   See AVS for additional education material      Advanced Directives:  Do you have a Living Will?: (!) No    Intervention:                         Objective   Vitals:    12/12/21 1453   BP: 120/68   Site:  Left Upper Arm   Position: Sitting   Cuff Size: Small Adult   Pulse: 81   Resp: 16   SpO2: 96%   Height: 5' (1.524 m)      Body mass index is 19.73 kg/m.           GENERAL APPEARANCE: well developed, well nourished, Patient is alert and oriented and pleasant.no acute distress. Appropriate affect.  Accompanied by son          HEAD: normocephalic, atraumatic.                   NECK:  neck supple, no thyromegaly.                   HEART: no murmurs,normal rate and regular rhythm,S1, S2 normal, no le edema.           LUNGS: clear to auscultation bilaterally.   SKIN: Posterior right lower leg with large area of erythema that is not warm or tender, face: Beefy red rash around mouth and lower half of face, anterior right shin with open wound that has surrounding granulation tissue and a clean base          MUSCULOSKELETAL: Sitting in wheelchair          NEUROLOGIC:  nonfocal, alert and oriented, cognitive exam grossly normal, cooperative with  exam, gait normal.           PSYCH: alert, oriented, cognitive function intact, judgment and insight good, mood/affect appropriate   No Known Allergies  Prior to Visit Medications    Medication Sig Taking? Authorizing Provider   calcium carb-cholecalciferol (CALTRATE 600+D3) 600-20 MG-MCG TABS   1 tabs, Oral, BID, # 60 tabs, 0 Refill(s) Yes Historical Provider, MD   nystatin-triamcinolone (MYCOLOG II) 100000-0.1 UNIT/GM-% cream Apply topically 2 times daily. Yes Vanita Ingles, MD   omeprazole (PRILOSEC) 40 MG delayed release capsule TAKE 1 CAPSULE BY MOUTH EVERY MORNING FOR REFLUX  Historical Provider, MD       CareTeam (Including outside providers/suppliers regularly involved in providing care):   Patient Care Team:  Vanita Ingles, MD as PCP - General  Vanita Ingles, MD as PCP - Empaneled Provider     Reviewed and updated this visit:  Tobacco  Allergies  Meds  Problems  Med Hx  Surg Hx  Soc Hx  Fam Hx               Vanita Ingles, MD

## 2021-12-12 ENCOUNTER — Ambulatory Visit: Admit: 2021-12-12 | Discharge: 2021-12-12 | Payer: MEDICARE | Attending: Family Medicine | Primary: Family Medicine

## 2021-12-12 DIAGNOSIS — Z0000 Encounter for general adult medical examination without abnormal findings: Secondary | ICD-10-CM

## 2021-12-12 MED ORDER — NYSTATIN-TRIAMCINOLONE 100000-0.1 UNIT/GM-% EX CREA
100000-0.1 | CUTANEOUS | 0 refills | 30.00000 days | Status: DC
Start: 2021-12-12 — End: 2024-02-11

## 2021-12-12 NOTE — Patient Instructions (Signed)
Learning About Vision Tests  What are vision tests?     The four most common vision tests are visual acuity tests, refraction, visual field tests, and color vision tests.  Visual acuity (sharpness) tests  These tests are used:  To see if you need glasses or contact lenses.  To monitor an eye problem.  To check an eye injury.  Visual acuity tests are done as part of routine exams. You may also have this test when you get your driver's license or apply for some types of jobs.  Visual field tests  These tests are used:  To check for vision loss in any area of your range of vision.  To screen for certain eye diseases.  To look for nerve damage after a stroke, head injury, or other problem that could reduce blood flow to the brain.  Refraction and color tests  A refraction test is done to find the right prescription for glasses and contact lenses.  A color vision test is done to check for color blindness.  Color vision is often tested as part of a routine exam. You may also have this test when you apply for a job where recognizing different colors is important, such as truck driving, Research officer, trade union, or the [employer].  How are vision tests done?  Visual acuity test   You cover one eye at a time.  You read aloud from a wall chart across the room.  You read aloud from a small card that you hold in your hand.  Refraction   You look into a special device.  The device puts lenses of different strengths in front of each eye to see how strong your glasses or contact lenses need to be.  Visual field tests   Your doctor may have you look through special machines.  Or your doctor may simply have you stare straight ahead while they move a finger into and out of your field of vision.  Color vision test   You look at pieces of printed test patterns in various colors. You say what number or symbol you see.  Your doctor may have you trace the number or symbol using a pointer.  How do these tests feel?  There is very little chance of  having a problem from this test. If dilating drops are used for a vision test, they may make the eyes sting and cause a medicine taste in the mouth.  Follow-up care is a key part of your treatment and safety. Be sure to make and go to all appointments, and call your doctor if you are having problems. It's also a good idea to know your test results and keep a list of the medicines you take.  Where can you learn more?  Go to https://www.bennett.info/ and enter G551 to learn more about "Learning About Vision Tests."  Current as of: October 12, 2022Content Version: 13.6   2006-2023 Healthwise, Incorporated.   Care instructions adapted under license by [facility]. If you have questions about a medical condition or this instruction, always ask your healthcare professional. [place] any warranty or liability for your use of this information.           Advance Directives: Care Instructions  Overview  An advance directive is a legal way to state your wishes at the end of your life. It tells your family and your doctor what to do if you can't say what you want.  There are two main types of advance directives. You  can change them any time your wishes change.  Living will.  This form tells your family and your doctor your wishes about life support and other treatment. The form is also called a declaration.  Medical power of attorney.  This form lets you name a person to make treatment decisions for you when you can't speak for yourself. This person is called a health care agent (health care proxy, health care surrogate). The form is also called a durable power of attorney for health care.  If you do not have an advance directive, decisions about your medical care may be made by a family member, or by a doctor or a judge who doesn't know you.  It may help to think of an advance directive as a gift to the people who care for you. If you have one, they won't have to make tough  decisions by themselves.  For more information, including forms for your state, see the [place] website (RebankingSpace.hu).  Follow-up care is a key part of your treatment and safety. Be sure to make and go to all appointments, and call your doctor if you are having problems. It's also a good idea to know your test results and keep a list of the medicines you take.  What should you include in an advance directive?  Many states have a unique advance directive form. (It may ask you to address specific issues.) Or you might use a universal form that's approved by many states.  If your form doesn't tell you what to address, it may be hard to know what to include in your advance directive. Use the questions below to help you get started.  Who do you want to make decisions about your medical care if you are not able to?  What life-support measures do you want if you have a serious illness that gets worse over time or can't be cured?  What are you most afraid of that might happen? (Maybe you're afraid of having pain, losing your independence, or being kept alive by machines.)  Where would you prefer to die? (Your home? A hospital? A nursing home?)  Do you want to donate your organs when you die?  Do you want certain religious practices performed before you die?  When should you call for help?  Be sure to contact your doctor if you have any questions.  Where can you learn more?  Go to https://www.bennett.info/ and enter R264 to learn more about "Advance Directives: Care Instructions."  Current as of: June 16, 2022Content Version: 13.6   2006-2023 Healthwise, Incorporated.   Care instructions adapted under license by [facility]. If you have questions about a medical condition or this instruction, always ask your healthcare professional. [place] any warranty or liability for your use of this information.           A Healthy Heart: Care  Instructions  Your Care Instructions     Coronary artery disease, also called heart disease, occurs when a substance called plaque builds up in the vessels that supply oxygen-rich blood to your heart muscle. This can narrow the blood vessels and reduce blood flow. A heart attack happens when blood flow is completely blocked. A high-fat diet, smoking, and other factors increase the risk of heart disease.  Your doctor has found that you have a chance of having heart disease. You can do lots of things to keep your heart healthy. It may not be easy, but you can change your diet, exercise  more, and quit smoking. These steps really work to lower your chance of heart disease.  Follow-up care is a key part of your treatment and safety. Be sure to make and go to all appointments, and call your doctor if you are having problems. It's also a good idea to know your test results and keep a list of the medicines you take.  How can you care for yourself at home?  Diet   Use less salt when you cook and eat. This helps lower your blood pressure. Taste food before salting. Add only a little salt when you think you need it. With time, your taste buds will adjust to less salt.    Eat fewer snack items, fast foods, canned soups, and other high-salt, high-fat, processed foods.    Read food labels and try to avoid saturated and trans fats. They increase your risk of heart disease by raising cholesterol levels.    Limit the amount of solid fat-butter, margarine, and shortening-you eat. Use olive, peanut, or canola oil when you cook. Bake, broil, and steam foods instead of frying them.    Eat a variety of fruit and vegetables every day. Dark green, deep orange, red, or yellow fruits and vegetables are especially good for you. Examples include spinach, carrots, peaches, and berries.    Foods high in fiber can reduce your cholesterol and provide important vitamins and minerals. High-fiber foods include whole-grain cereals and breads,  oatmeal, beans, brown rice, citrus fruits, and apples.    Eat lean proteins. Heart-healthy proteins include seafood, lean meats and poultry, eggs, beans, peas, nuts, seeds, and soy products.    Limit drinks and foods with added sugar. These include candy, desserts, and soda pop.   Lifestyle changes   If your doctor recommends it, get more exercise. Walking is a good choice. Bit by bit, increase the amount you walk every day. Try for at least 30 minutes on most days of the week. You also may want to swim, bike, or do other activities.    Do not smoke. If you need help quitting, talk to your doctor about stop-smoking programs and medicines. These can increase your chances of quitting for good. Quitting smoking may be the most important step you can take to protect your heart. It is never too late to quit.    Limit alcohol to 2 drinks a day for men and 1 drink a day for women. Too much alcohol can cause health problems.    Manage other health problems such as diabetes, high blood pressure, and high cholesterol. If you think you may have a problem with alcohol or drug use, talk to your doctor.   Medicines   Take your medicines exactly as prescribed. Call your doctor if you think you are having a problem with your medicine.    If your doctor recommends aspirin, take the amount directed each day. Make sure you take aspirin and not another kind of pain reliever, such as acetaminophen (Tylenol).   When should you call for help?   Call 911 if you have symptoms of a heart attack. These may include:   Chest pain or pressure, or a strange feeling in the chest.    Sweating.    Shortness of breath.    Pain, pressure, or a strange feeling in the back, neck, jaw, or upper belly or in one or both shoulders or arms.    Lightheadedness or sudden weakness.    A fast or irregular heartbeat.  After you call 911, the operator may tell you to chew 1 adult-strength or 2 to 4 low-dose aspirin. Wait for an ambulance. Do not try  to drive yourself.  Watch closely for changes in your health, and be sure to contact your doctor if you have any problems.  Where can you learn more?  Go to RecruitSuit.ca and enter F075 to learn more about "A Healthy Heart: Care Instructions."  Current as of: September 7, 2022Content Version: 13.6   2006-2023 Healthwise, Incorporated.   Care instructions adapted under license by [facility]. If you have questions about a medical condition or this instruction, always ask your healthcare professional. Healthwise, Incorporated disclaims any warranty or liability for your use of this information.      Personalized Preventive Plan for Linda Murray - 12/12/2021  Medicare offers a range of preventive health benefits. Some of the tests and screenings are paid in full while other may be subject to a deductible, co-insurance, and/or copay.    Some of these benefits include a comprehensive review of your medical history including lifestyle, illnesses that may run in your family, and various assessments and screenings as appropriate.    After reviewing your medical record and screening and assessments performed today your provider may have ordered immunizations, labs, imaging, and/or referrals for you.  A list of these orders (if applicable) as well as your Preventive Care list are included within your After Visit Summary for your review.    Other Preventive Recommendations:    A preventive eye exam performed by an eye specialist is recommended every 1-2 years to screen for glaucoma; cataracts, macular degeneration, and other eye disorders.  A preventive dental visit is recommended every 6 months.  Try to get at least 150 minutes of exercise per week or 10,000 steps per day on a pedometer .  Order or download the FREE "Exercise & Physical Activity: Your Everyday Guide" from [employer]. Call (651)792-0363 or search [employer] online.  You need  1200-1500 mg of calcium and 1000-2000 IU of vitamin D per day. It is possible to meet your calcium requirement with diet alone, but a vitamin D supplement is usually necessary to meet this goal.  When exposed to the sun, use a sunscreen that protects against both UVA and UVB radiation with an SPF of 30 or greater. Reapply every 2 to 3 hours or after sweating, drying off with a towel, or swimming.  Always wear a seat belt when traveling in a car. Always wear a helmet when riding a bicycle or motorcycle.

## 2021-12-12 NOTE — Progress Notes (Signed)
Health Maintenance:  Flu: Date of Last WILL GET TODAY Pneumovax Date of Last 08/04/05 DUE   Prevnar13 Date of Last 09/11/15  Tdap Date of Last TD 12/12/09  Shingrix: Date of Last ONE DOSE COMPLETED   COVID-19 Immunization Vaccination COMPLETED PFIZER 11/30/20  Cardiovascular Screening Date of Last DUE   Diabetes Screening Date of Last DUE   Pap/Pelvic Date of Last >65  Colorectal Cancer Screening >75  Mammography Date of Last  >75  Bone Density Date of Last 12/07/19  Bone Density Date Due DUE/ORDERED BY KATHRYN CONNER,PA  STD screening  Hepatitis C Screening Date of Last NOT AGE REQUIRED   Lung Cancer   2. Seizure disorder - G40.909, stable,finally willing to see neuro and discusss alternatives to dilantin  3. Difficulty in walking, not elsewhere classified - R26.2  4. Osteoporosis - M81.0, continue prolia  5. Need for TD vaccine - Z23, she will getScreening: Date of Last

## 2022-03-18 ENCOUNTER — Encounter: Payer: MEDICARE | Attending: Medical | Primary: Family Medicine

## 2022-03-18 NOTE — Progress Notes (Unsigned)
[facility]   Established Patient Note  Roney Jaffe, PA-C    HISTORY  Linda Murray is an 81 y.o. female seen for continued osteoporosis management.    Falls since last visit:  Denies  Fractures since last visit: Denies  Medication adverse effects:  Denies  Taking calcium and vitamin D: Admits  Weight bearing exercise: Admits    RELEVANT REVIEW OF SYSTEMS IS NOTED IN THE HPI    No Known Allergies    Current Outpatient Medications   Medication Sig Dispense Refill    omeprazole (PRILOSEC) 40 MG delayed release capsule TAKE 1 CAPSULE BY MOUTH EVERY MORNING FOR REFLUX      calcium carb-cholecalciferol (CALTRATE 600+D3) 600-20 MG-MCG TABS   1 tabs, Oral, BID, # 60 tabs, 0 Refill(s)      nystatin-triamcinolone (MYCOLOG II) 100000-0.1 UNIT/GM-% cream Apply topically 2 times daily. 1 each 0     No current facility-administered medications for this visit.       Past Medical History:   Diagnosis Date    Brain aneurysm     (aphasia, hemiparesis right side).    CKD (chronic kidney disease), stage II     Hyperlipidemia     Osteoporosis     Seizure (HCC)     Urinary incontinence         Past Surgical History:   Procedure Laterality Date    BRAIN SURGERY         Social History     Socioeconomic History    Marital status: Widowed     Spouse name: Not on file    Number of children: Not on file    Years of education: Not on file    Highest education level: Not on file   Occupational History    Not on file   Tobacco Use    Smoking status: Former     Types: Cigarettes     Passive exposure: Never    Smokeless tobacco: Never   Substance and Sexual Activity    Alcohol use: Never    Drug use: Never    Sexual activity: Not on file   Other Topics Concern    Not on file   Social History Narrative    Not on file     Social Determinants of Health     Financial Resource Strain: Not on file   Food Insecurity: Not on file   Transportation Needs: Not on file   Physical Activity: Insufficiently Active     Days of Exercise per Week: 2 days    Minutes of Exercise per Session: 10 min   Stress: Not on file   Social Connections: Not on file   Intimate Partner Violence: Not on file   Housing Stability: Not on file        VITALS  Wt Readings from Last 1 Encounters:   08/13/21 101 lb (45.8 kg)     Ht Readings from Last 1 Encounters:   12/12/21 5' (1.524 m)       PHYSICAL EXAM  Constitutional: well-groomed, no acute distress, There is no height or weight on file to calculate BMI.  Psychiatric: normal affect, oriented to place, person, and situation  Respiratory: respirations unlabored, on room air; mask on  Skin: no rashes, wounds, or lesions seen on exposed skin    LABS  Lab Results   Component Value Date    BUN 25 (H) 02/03/2020    CREATININE 0.7 02/03/2020    CALCIUM  9.2 02/03/2020    LABALBU 4.1 02/03/2020    ALKPHOS 98 02/03/2020    LABGLOM 82 (L) 02/03/2020    GFRAA 96 02/03/2020       ASSESSMENT  No diagnosis found.      PLAN  Osteoporosis ( femoral neck, total hip)  Osteopenia ( spine)  DEXA ( 12/07/19)  Fragility fracture ( none)  Fall risk ( high, but is now in a wheelchair)  Prolia (started 2017)    DEXA from 2021, 2019, and 2017 were reviewed. These are listed from least to most recent.  Lumbar T score of -0.7, -0.7, -1.2  Femoral neck T score of -3.3, -3.5 (right hip), -2.6 (left hip)  Total hip T score of -3.3, -3.5 (right hip), -3.0 (left hip).    A few noted items about why she may not be seeing improvement: She had skipped two doses of Prolia, she has become wheelchair bound (disuse atrophy), and she has significant hyperparathyroidism. We have no comparison in the hip, but assuming symmetry, she is trending in the right direction.    Recent DEXA was re-reviewed and the patient is up to date, or the DEXA was ordered if due.  Any applicable recent labs reviewed. Calcium within one year is normal, or has been corrected since the lab draw if it was low.  10 minutes was spent in the insurance verification for  Prolia, and prior authorizations have been obtained if required.  Continue calcium and vitamin D daily from diet and/or supplement.   Continue weight bearing and core strengthening exercises to target goal of 150 minutes per week to improve bone density and reduce falls.  The skin was prepped with alcohol and Prolia was given subcutaneously without complication.   Advised patient to make me aware of any new falls or fractures that occur before the next scheduled visit.   Follow up in 6 months for next Prolia.             -Kara Pacer, PA-C  Program Director  [facility]

## 2022-03-31 NOTE — Telephone Encounter (Signed)
NEEDS TO R/S APPT CANNOT MAKE IT ON WEDNESDAY

## 2022-04-02 ENCOUNTER — Encounter: Payer: MEDICARE | Attending: Medical | Primary: Family Medicine

## 2022-04-02 NOTE — Telephone Encounter (Signed)
Please call to reschedule her Prolia from today.

## 2022-04-02 NOTE — Progress Notes (Unsigned)
[facility]   Established Patient Note  Roney Jaffe, PA-C    HISTORY  Linda Murray is an 81 y.o. female seen for continued osteoporosis management.    Falls since last visit:  Denies  Fractures since last visit: Denies  Medication adverse effects:  Denies  Taking calcium and vitamin D: Admits  Weight bearing exercise: Admits    RELEVANT REVIEW OF SYSTEMS IS NOTED IN THE HPI    No Known Allergies    Current Outpatient Medications   Medication Sig Dispense Refill    omeprazole (PRILOSEC) 40 MG delayed release capsule TAKE 1 CAPSULE BY MOUTH EVERY MORNING FOR REFLUX      calcium carb-cholecalciferol (CALTRATE 600+D3) 600-20 MG-MCG TABS   1 tabs, Oral, BID, # 60 tabs, 0 Refill(s)      nystatin-triamcinolone (MYCOLOG II) 100000-0.1 UNIT/GM-% cream Apply topically 2 times daily. 1 each 0     No current facility-administered medications for this visit.       Past Medical History:   Diagnosis Date    Brain aneurysm     (aphasia, hemiparesis right side).    CKD (chronic kidney disease), stage II     Hyperlipidemia     Osteoporosis     Seizure (HCC)     Urinary incontinence         Past Surgical History:   Procedure Laterality Date    BRAIN SURGERY         Social History     Socioeconomic History    Marital status: Widowed     Spouse name: Not on file    Number of children: Not on file    Years of education: Not on file    Highest education level: Not on file   Occupational History    Not on file   Tobacco Use    Smoking status: Former     Types: Cigarettes     Passive exposure: Never    Smokeless tobacco: Never   Substance and Sexual Activity    Alcohol use: Never    Drug use: Never    Sexual activity: Not on file   Other Topics Concern    Not on file   Social History Narrative    Not on file     Social Determinants of Health     Financial Resource Strain: Not on file   Food Insecurity: Not on file   Transportation Needs: Not on file   Physical Activity: Insufficiently Active     Days of Exercise per Week: 2 days    Minutes of Exercise per Session: 10 min   Stress: Not on file   Social Connections: Not on file   Intimate Partner Violence: Not on file   Housing Stability: Not on file        VITALS  Wt Readings from Last 1 Encounters:   08/13/21 101 lb (45.8 kg)     Ht Readings from Last 1 Encounters:   12/12/21 5' (1.524 m)       PHYSICAL EXAM  Constitutional: well-groomed, no acute distress, There is no height or weight on file to calculate BMI.  Psychiatric: normal affect, oriented to place, person, and situation  Respiratory: respirations unlabored, on room air; mask on  Skin: no rashes, wounds, or lesions seen on exposed skin    LABS  Lab Results   Component Value Date    BUN 25 (H) 02/03/2020    CREATININE 0.7 02/03/2020    CALCIUM  9.2 02/03/2020    LABALBU 4.1 02/03/2020    ALKPHOS 98 02/03/2020    LABGLOM 82 (L) 02/03/2020    GFRAA 96 02/03/2020       ASSESSMENT  No diagnosis found.      PLAN  Osteoporosis ( femoral neck, total hip)  Osteopenia ( spine)  DEXA ( 12/07/19)  Fragility fracture ( none)  Fall risk ( high, but is now in a wheelchair)  Prolia (started 2017)    DEXA from 2021, 2019, and 2017 were reviewed. These are listed from least to most recent.  Lumbar T score of -0.7, -0.7, -1.2  Femoral neck T score of -3.3, -3.5 (right hip), -2.6 (left hip)  Total hip T score of -3.3, -3.5 (right hip), -3.0 (left hip).    A few noted items about why she may not be seeing improvement: She had skipped two doses of Prolia, she has become wheelchair bound (disuse atrophy), and she has significant hyperparathyroidism. We have no comparison in the hip, but assuming symmetry, she is trending in the right direction.    Recent DEXA was re-reviewed and the patient is up to date, or the DEXA was ordered if due.  Any applicable recent labs reviewed. Calcium within one year is normal, or has been corrected since the lab draw if it was low.  10 minutes was spent in the insurance verification for  Prolia, and prior authorizations have been obtained if required.  Continue calcium and vitamin D daily from diet and/or supplement.   Continue weight bearing and core strengthening exercises to target goal of 150 minutes per week to improve bone density and reduce falls.  The skin was prepped with alcohol and Prolia was given subcutaneously without complication.   Advised patient to make me aware of any new falls or fractures that occur before the next scheduled visit.   Follow up in 6 months for next Prolia.             -Kara Pacer, PA-C  Program Director  [facility]

## 2022-04-10 ENCOUNTER — Encounter: Admit: 2022-04-10 | Discharge: 2022-04-10 | Payer: MEDICARE | Attending: Medical | Primary: Family Medicine

## 2022-04-10 DIAGNOSIS — M810 Age-related osteoporosis without current pathological fracture: Secondary | ICD-10-CM

## 2022-04-10 MED ORDER — DENOSUMAB 60 MG/ML SC SOSY
60 MG/ML | Freq: Once | SUBCUTANEOUS | Status: AC
Start: 2022-04-10 — End: 2022-04-10
  Administered 2022-04-10: 16:00:00 60 mg via SUBCUTANEOUS

## 2022-04-10 NOTE — Progress Notes (Signed)
[facility]   Established Patient Note  Aram Candela, PA-C    HISTORY  Linda Murray is an 81 y.o. female seen for continued osteoporosis management.    Falls since last visit:  Denies  Fractures since last visit: Denies  Medication adverse effects:  Denies  Taking calcium and vitamin D: Admits  Weight bearing exercise: Admits    RELEVANT REVIEW OF SYSTEMS IS NOTED IN THE HPI    No Known Allergies    Current Outpatient Medications   Medication Sig Dispense Refill    omeprazole (PRILOSEC) 40 MG delayed release capsule TAKE 1 CAPSULE BY MOUTH EVERY MORNING FOR REFLUX      calcium carb-cholecalciferol (CALTRATE 600+D3) 600-20 MG-MCG TABS   1 tabs, Oral, BID, # 60 tabs, 0 Refill(s)      nystatin-triamcinolone (MYCOLOG II) 100000-0.1 UNIT/GM-% cream Apply topically 2 times daily. 1 each 0     No current facility-administered medications for this visit.       Past Medical History:   Diagnosis Date    Brain aneurysm     (aphasia, hemiparesis right side).    CKD (chronic kidney disease), stage II     Hyperlipidemia     Osteoporosis     Seizure ([place])     Urinary incontinence         Past Surgical History:   Procedure Laterality Date    BRAIN SURGERY         Social History     Socioeconomic History    Marital status: Widowed     Spouse name: Not on file    Number of children: Not on file    Years of education: Not on file    Highest education level: Not on file   Occupational History    Not on file   Tobacco Use    Smoking status: Former     Types: Cigarettes     Passive exposure: Never    Smokeless tobacco: Never   Substance and Sexual Activity    Alcohol use: Never    Drug use: Never    Sexual activity: Not on file   Other Topics Concern    Not on file   Social History Narrative    Not on file     Social Determinants of Health     Financial Resource Strain: Not on file   Food Insecurity: Not on file   Transportation Needs: Not on file   Physical Activity: Insufficiently Active  (12/12/2021)    Exercise Vital Sign     Days of Exercise per Week: 2 days     Minutes of Exercise per Session: 10 min   Stress: Not on file   Social Connections: Not on file   Intimate Partner Violence: Not on file   Housing Stability: Not on file        VITALS  Wt Readings from Last 1 Encounters:   04/10/22 101 lb (45.8 kg)     Ht Readings from Last 1 Encounters:   04/10/22 5' (1.524 m)       PHYSICAL EXAM  Constitutional: well-groomed, no acute distress, Body mass index is 19.73 kg/m.  Psychiatric: normal affect, oriented to place, person, and situation  Respiratory: respirations unlabored, on room air; mask on  Skin: no rashes, wounds, or lesions seen on exposed skin    LABS  Lab Results   Component Value Date    BUN 25 (H) 02/03/2020    CREATININE 0.7 02/03/2020  CALCIUM 9.2 02/03/2020    LABALBU 4.1 02/03/2020    ALKPHOS 98 02/03/2020    LABGLOM 82 (L) 02/03/2020    GFRAA 96 02/03/2020       ASSESSMENT    ICD-10-CM    1. Age-related osteoporosis without current pathological fracture  M81.0 denosumab (PROLIA) SC injection 60 mg     Calcium     Vitamin D 25 Hydroxy     PTH, Intact     DEXA BONE DENSITY AXIAL SKELETON          PLAN  Osteoporosis ( femoral neck, total hip)  Osteopenia ( spine)  DEXA ( 12/07/19)  Fragility fracture ( none)  Fall risk ( high, but is now in a wheelchair)  Prolia (started 2017)    DEXA from 2021, 2019, and 2017 were reviewed. These are listed from least to most recent.  Lumbar T score of -0.7, -0.7, -1.2  Femoral neck T score of -3.3, -3.5 (right hip), -2.6 (left hip)  Total hip T score of -3.3, -3.5 (right hip), -3.0 (left hip).    A few noted items about why she may not be seeing improvement: She had skipped two doses of Prolia, she has become wheelchair bound (disuse atrophy), and she has significant hyperparathyroidism. We have no comparison in the hip, but assuming symmetry, she is trending in the right direction.    Recent DEXA was re-reviewed and the patient is up to date, or  the DEXA was ordered if due.  Any applicable recent labs reviewed. Calcium within one year is normal, or has been corrected since the lab draw if it was low.  10 minutes was spent in the insurance verification for Prolia, and prior authorizations have been obtained if required.  Continue calcium and vitamin D daily from diet and/or supplement.   Continue weight bearing and core strengthening exercises to target goal of 150 minutes per week to improve bone density and reduce falls.  The skin was prepped with alcohol and Prolia was given subcutaneously without complication.   Advised patient to make me aware of any new falls or fractures that occur before the next scheduled visit.   Follow up in 6 months for next Prolia.             -Kara Pacer, PA-C  Program Director  [facility]

## 2022-04-29 NOTE — Telephone Encounter (Signed)
[place] PA 03/12/22 to 03/13/23 Auth [id number]

## 2022-07-08 NOTE — Telephone Encounter (Signed)
Linda Murray called and request we contact [facility] regarding the wheel chair order, patient has been waiting several months and [facility] advised pts son we needed to send more documentation

## 2022-07-23 NOTE — Telephone Encounter (Signed)
SCANNED AND FAXED.

## 2022-07-23 NOTE — Telephone Encounter (Signed)
done

## 2022-08-11 ENCOUNTER — Encounter

## 2022-08-12 LAB — BASIC METABOLIC PANEL
Anion Gap: 11 mmol/L (ref 2–17)
BUN: 29 mg/dL — ABNORMAL HIGH (ref 8–23)
CO2: 28 mmol/L (ref 22–29)
Calcium: 11.6 mg/dL — ABNORMAL HIGH (ref 8.8–10.2)
Chloride: 104 mmol/L (ref 98–107)
Creatinine: 1.1 mg/dL — ABNORMAL HIGH (ref 0.5–1.0)
Est, Glom Filt Rate: 50 mL/min/1.73m — ABNORMAL LOW (ref >=60)
Glucose: 92 mg/dL (ref 70–99)
OSMOLALITY CALCULATED: 290 mOsm/kg — ABNORMAL HIGH (ref 270–287)
Potassium: 4.8 mmol/L (ref 3.5–5.3)
Sodium: 143 mmol/L (ref 135–145)

## 2022-08-12 LAB — PTH, INTACT: Pth Intact: 22.9 pg/mL (ref 15.00–65.00)

## 2022-08-12 LAB — VITAMIN D 25 HYDROXY: Vit D, 25-Hydroxy: 61.9 ng/mL (ref 30.0–90.0)

## 2022-08-12 NOTE — Telephone Encounter (Signed)
Please tell her that her calcium was elevated. Discontinue calcium supplements for two weeks and then only take 600mg .

## 2022-08-12 NOTE — Other (Signed)
Discontinue all calcium supplements for 2 weeks and then only take 600mg .     Thanks,   Gershon Crane, PA-C  [facility] Director

## 2022-10-16 ENCOUNTER — Encounter: Payer: MEDICARE | Attending: Medical | Primary: Family Medicine

## 2022-10-16 NOTE — Progress Notes (Unsigned)
[facility]   Established Patient Note  Aram Candela, PA-C    HISTORY  Linda Murray is an 82 y.o. female seen for continued osteoporosis management.    Falls since last visit:  Denies  Fractures since last visit: Denies  Medication adverse effects:  Denies  Taking calcium and vitamin D: Admits  Weight bearing exercise: Admits    RELEVANT REVIEW OF SYSTEMS IS NOTED IN THE HPI    No Known Allergies    Current Outpatient Medications   Medication Sig Dispense Refill    omeprazole (PRILOSEC) 40 MG delayed release capsule TAKE 1 CAPSULE BY MOUTH EVERY MORNING FOR REFLUX      calcium carb-cholecalciferol (CALTRATE 600+D3) 600-20 MG-MCG TABS   1 tabs, Oral, BID, # 60 tabs, 0 Refill(s)      nystatin-triamcinolone (MYCOLOG II) 100000-0.1 UNIT/GM-% cream Apply topically 2 times daily. 1 each 0     No current facility-administered medications for this visit.       Past Medical History:   Diagnosis Date    Brain aneurysm     (aphasia, hemiparesis right side).    CKD (chronic kidney disease), stage II     Hyperlipidemia     Osteoporosis     Seizure ([place])     Urinary incontinence         Past Surgical History:   Procedure Laterality Date    BRAIN SURGERY         Social History     Socioeconomic History    Marital status: Widowed     Spouse name: Not on file    Number of children: Not on file    Years of education: Not on file    Highest education level: Not on file   Occupational History    Not on file   Tobacco Use    Smoking status: Former     Types: Cigarettes     Passive exposure: Never    Smokeless tobacco: Never   Substance and Sexual Activity    Alcohol use: Never    Drug use: Never    Sexual activity: Not on file   Other Topics Concern    Not on file   Social History Narrative    Not on file     Social Determinants of Health     Financial Resource Strain: Not on file   Food Insecurity: Not on file   Transportation Needs: Not on file   Physical Activity: Insufficiently Active  (12/12/2021)    Exercise Vital Sign     Days of Exercise per Week: 2 days     Minutes of Exercise per Session: 10 min   Stress: Not on file   Social Connections: Not on file   Intimate Partner Violence: Not on file   Housing Stability: Not on file        VITALS  Wt Readings from Last 1 Encounters:   04/10/22 45.8 kg (101 lb)     Ht Readings from Last 1 Encounters:   04/10/22 1.524 m (5')       PHYSICAL EXAM  Constitutional: well-groomed, no acute distress, There is no height or weight on file to calculate BMI.  Psychiatric: normal affect, oriented to place, person, and situation  Respiratory: respirations unlabored, on room air; mask on  Skin: no rashes, wounds, or lesions seen on exposed skin    LABS  Lab Results   Component Value Date    BUN 29 (H) 08/11/2022  CREATININE 1.1 (H) 08/11/2022    CALCIUM 11.6 (H) 08/11/2022    LABALBU 4.1 02/03/2020    ALKPHOS 98 02/03/2020    LABGLOM 50 (L) 08/11/2022    GFRAA 96 02/03/2020       ASSESSMENT    ICD-10-CM    1. Age-related osteoporosis without current pathological fracture  M81.0           PLAN  Osteoporosis ( femoral neck, total hip)  Osteopenia ( spine)  DEXA ( 12/07/19)  Fragility fracture ( none)  Fall risk ( high, but is now in a wheelchair)  Prolia (started 2017)    DEXA from 2021, 2019, and 2017 were reviewed. These are listed from least to most recent.  Lumbar T score of -0.7, -0.7, -1.2  Femoral neck T score of -3.3, -3.5 (right hip), -2.6 (left hip)  Total hip T score of -3.3, -3.5 (right hip), -3.0 (left hip).    A few noted items about why she may not be seeing improvement: She had skipped two doses of Prolia, she has become wheelchair bound (disuse atrophy), and she has significant hyperparathyroidism. We have no comparison in the hip, but assuming symmetry, she is trending in the right direction.    Recent DEXA was re-reviewed and the patient is up to date, or the DEXA was ordered if due.  Any applicable recent labs reviewed. Calcium within one year is  normal, or has been corrected since the lab draw if it was low.  10 minutes was spent in the insurance verification for Prolia, and prior authorizations have been obtained if required.  Continue calcium and vitamin D daily from diet and/or supplement.   Continue weight bearing and core strengthening exercises to target goal of 150 minutes per week to improve bone density and reduce falls.  The skin was prepped with alcohol and Prolia was given subcutaneously without complication.   Advised patient to make me aware of any new falls or fractures that occur before the next scheduled visit.   Follow up in 6 months for next Prolia.             -Quentin Angst, PA-C  Program Director  [facility]

## 2022-10-28 NOTE — Telephone Encounter (Signed)
PA for wheelchair has been approved

## 2022-10-28 NOTE — Telephone Encounter (Signed)
Spoke with representative from [employer], she stated she has some questions regarding a prior authorization they received for the patient. Please call [id number] case #[id number]

## 2022-11-03 ENCOUNTER — Ambulatory Visit: Admit: 2022-11-03 | Discharge: 2022-11-03 | Payer: MEDICARE | Attending: Medical | Primary: Family Medicine

## 2022-11-03 DIAGNOSIS — M810 Age-related osteoporosis without current pathological fracture: Secondary | ICD-10-CM

## 2022-11-03 MED ORDER — DENOSUMAB 60 MG/ML SC SOSY
60 | Freq: Once | SUBCUTANEOUS | Status: AC
Start: 2022-11-03 — End: 2022-11-03
  Administered 2022-11-03: 15:00:00 60 mg via SUBCUTANEOUS

## 2022-11-03 NOTE — Progress Notes (Signed)
[facility]   Established Patient Note  Roney Jaffe, PA-C    HISTORY  Linda Murray is an 82 y.o. female seen for continued osteoporosis management.    Falls since last visit:  Denies  Fractures since last visit: Denies  Medication adverse effects:  Denies  Taking calcium and vitamin D: Admits  Weight bearing exercise: Admits    RELEVANT REVIEW OF SYSTEMS IS NOTED IN THE HPI    No Known Allergies    Current Outpatient Medications   Medication Sig Dispense Refill    omeprazole (PRILOSEC) 40 MG delayed release capsule TAKE 1 CAPSULE BY MOUTH EVERY MORNING FOR REFLUX      calcium carb-cholecalciferol (CALTRATE 600+D3) 600-20 MG-MCG TABS   1 tabs, Oral, BID, # 60 tabs, 0 Refill(s)      nystatin-triamcinolone (MYCOLOG II) 100000-0.1 UNIT/GM-% cream Apply topically 2 times daily. 1 each 0     Current Facility-Administered Medications   Medication Dose Route Frequency Provider Last Rate Last Admin    denosumab (PROLIA) SC injection 60 mg  60 mg SubCUTAneous Once Kara Pacer, PA           Past Medical History:   Diagnosis Date    Brain aneurysm     (aphasia, hemiparesis right side).    CKD (chronic kidney disease), stage II     Hyperlipidemia     Osteoporosis     Seizure (HCC)     Urinary incontinence         Past Surgical History:   Procedure Laterality Date    BRAIN SURGERY         Social History     Socioeconomic History    Marital status: Widowed     Spouse name: Not on file    Number of children: Not on file    Years of education: Not on file    Highest education level: Not on file   Occupational History    Not on file   Tobacco Use    Smoking status: Former     Types: Cigarettes     Passive exposure: Never    Smokeless tobacco: Never   Substance and Sexual Activity    Alcohol use: Never    Drug use: Never    Sexual activity: Not on file   Other Topics Concern    Not on file   Social History Narrative    Not on file     Social Determinants of Health     Financial  Resource Strain: Not on file   Food Insecurity: Not on file   Transportation Needs: Not on file   Physical Activity: Insufficiently Active (12/12/2021)    Exercise Vital Sign     Days of Exercise per Week: 2 days     Minutes of Exercise per Session: 10 min   Stress: Not on file   Social Connections: Not on file   Intimate Partner Violence: Not on file   Housing Stability: Not on file        VITALS  Wt Readings from Last 1 Encounters:   11/03/22 45.8 kg (101 lb)     Ht Readings from Last 1 Encounters:   11/03/22 1.524 m (5')       PHYSICAL EXAM  Constitutional: well-groomed, no acute distress, Body mass index is 19.73 kg/m.  Psychiatric: normal affect, oriented to place, person, and situation  Respiratory: respirations unlabored, on room air; mask on  Skin: no rashes, wounds, or lesions  seen on exposed skin    LABS  Lab Results   Component Value Date    BUN 29 (H) 08/11/2022    CREATININE 1.1 (H) 08/11/2022    CALCIUM 11.6 (H) 08/11/2022    LABALBU 4.1 02/03/2020    ALKPHOS 98 02/03/2020    LABGLOM 50 (L) 08/11/2022    GFRAA 96 02/03/2020       ASSESSMENT    ICD-10-CM    1. Age-related osteoporosis without current pathological fracture  M81.0 denosumab (PROLIA) SC injection 60 mg          PLAN  Osteoporosis ( femoral neck, total hip)  Osteopenia ( spine)  DEXA ( 12/07/19, unable to have safely, unable to get on scanner table)  Fragility fracture ( none)  Fall risk ( high, but is now in a wheelchair)  Prolia (started 2017)    DEXA from 2021, 2019, and 2017 were reviewed. These are listed from least to most recent.  Lumbar T score of -0.7, -0.7, -1.2  Femoral neck T score of -3.3, -3.5 (right hip), -2.6 (left hip)  Total hip T score of -3.3, -3.5 (right hip), -3.0 (left hip).    A few noted items about why she may not be seeing improvement: She had skipped two doses of Prolia, she has become wheelchair bound (disuse atrophy), and she has significant hyperparathyroidism. We have no comparison in the hip, but assuming  symmetry, she is trending in the right direction.    Recent DEXA was re-reviewed and the patient is up to date, or the DEXA was ordered if due.  Any applicable recent labs reviewed. Calcium within one year is normal, or has been corrected since the lab draw if it was low.  10 minutes was spent in the insurance verification for Prolia, and prior authorizations have been obtained if required.  Continue calcium and vitamin D daily from diet and/or supplement.   Continue weight bearing and core strengthening exercises to target goal of 150 minutes per week to improve bone density and reduce falls.  The skin was prepped with alcohol and Prolia was given subcutaneously without complication.   Advised patient to make me aware of any new falls or fractures that occur before the next scheduled visit.   Follow up in 6 months for next Prolia.             -Kara Pacer, PA-C  Program Director  [facility]

## 2022-12-18 ENCOUNTER — Encounter: Attending: Family Medicine | Primary: Family Medicine

## 2023-02-10 NOTE — Progress Notes (Unsigned)
Health Maintenance:  Flu: Date of Last   Pneumovax Date of Last 08/04/2005  Prevnar13 Date of Last 09/11/2015  Tdap Date of Last NO RECORD ON DHEC   Shingrix: Date of Last NO RECORD ON DHEC   COVID-19 Immunization Vaccination Completed pfizer 11/2021  Cardiovascular Screening Date of Last LIPID  Diabetes Screening Date of Last A1C  Pap/Pelvic Date of Last >65  Colorectal Cancer Screening > 75  Mammography Date of Last > 74  Bone Density Date of Last 12/07/2019  Bone Density Date Due ORDERED   Hepatitis C Screening Date of Last NOT AGE REQUIRED   Lung Cancer Screening: Date of Last  N/A

## 2023-02-11 ENCOUNTER — Ambulatory Visit: Admit: 2023-02-11 | Discharge: 2023-02-11 | Payer: MEDICARE | Attending: Family Medicine | Primary: Family Medicine

## 2023-02-11 DIAGNOSIS — Z0000 Encounter for general adult medical examination without abnormal findings: Secondary | ICD-10-CM

## 2023-02-11 NOTE — Patient Instructions (Signed)
Preventing Falls: Care Instructions  Injuries and health problems such as trouble walking or poor eyesight can increase your risk of falling. So can some medicines. But there are things you can do to help prevent falls. You can exercise to get stronger. You can also arrange your home to make it safer.    Talk to your doctor about the medicines you take. Ask if any of them increase the risk of falls and whether they can be changed or stopped.   Try to exercise regularly. It can help improve your strength and balance. This can help lower your risk of falling.         Practice fall safety and prevention.   Wear low-heeled shoes that fit well and give your feet good support. Talk to your doctor if you have foot problems that make this hard.  Carry a cellphone or wear a medical alert device that you can use to call for help.  Use stepladders instead of chairs to reach high objects. Don't climb if you're at risk for falls. Ask for help, if needed.  Wear the correct eyeglasses, if you need them.        Make your home safer.   Remove rugs, cords, clutter, and furniture from walkways.  Keep your house well lit. Use night-lights in hallways and bathrooms.  Install and use sturdy handrails on stairways.  Wear nonskid footwear, even inside. Don't walk barefoot or in socks without shoes.        Be safe outside.   Use handrails, curb cuts, and ramps whenever possible.  Keep your hands free by using a shoulder bag or backpack.  Try to walk in well-lit areas. Watch out for uneven ground, changes in pavement, and debris.  Be careful in the winter. Walk on the grass or gravel when sidewalks are slippery. Use de-icer on steps and walkways. Add non-slip devices to shoes.    Put grab bars and nonskid mats in your shower or tub and near the toilet. Try to use a shower chair or bath bench when bathing.   Get into a tub or shower by putting in your weaker leg first. Get out with your strong side first. Have a phone or medical alert  device in the bathroom with you.   Where can you learn more?  Go to [email or web address] and enter G117 to learn more about "Preventing Falls: Care Instructions."  Current as of: February 03, 2022  Content Version: 14.1   2006-2024 [employer].   Care instructions adapted under license by [facility]. If you have questions about a medical condition or this instruction, always ask your healthcare professional. [employer] Inc[employer] disclaims any warranty or liability for your use of this information.           Learning About Being Active as an Older Adult  Why is being active important as you get older?     Being active is one of the best things you can do for your health. And it's never too late to start. Being active--or getting active, if you aren't already--has definite benefits. It can:  Give you more energy,  Keep your mind sharp.  Improve balance to reduce your risk of falls.  Help you manage chronic illness with fewer medicines.  No matter how old you are, how fit you are, or what health problems you have, there is a form of activity that will work for you. And the more physical activity you can do, the better your overall  health will be.  What kinds of activity can help you stay healthy?  Being more active will make your daily activities easier. Physical activity includes planned exercise and things you do in daily life. There are four types of activity:  Aerobic.  Doing aerobic activity makes your heart and lungs strong.  Includes walking, dancing, and gardening.  Aim for at least 2 hours spread throughout the week.  It improves your energy and can help you sleep better.  Muscle-strengthening.  This type of activity can help maintain muscle and strengthen bones.  Includes climbing stairs, using resistance bands, and lifting or carrying heavy loads.  Aim for at least twice a week.  It can help protect the knees and other joints.  Stretching.  Stretching gives you better range  of motion in joints and muscles.  Includes upper arm stretches, calf stretches, and gentle yoga.  Aim for at least twice a week, preferably after your muscles are warmed up from other activities.  It can help you function better in daily life.  Balancing.  This helps you stay coordinated and have good posture.  Includes heel-to-toe walking, tai chi, and certain types of yoga.  Aim for at least 3 days a week.  It can reduce your risk of falling.  Even if you have a hard time meeting the recommendations, it's better to be more active than less active. All activity done in each category counts toward your weekly total. You'd be surprised how daily things like carrying groceries, keeping up with grandchildren, and taking the stairs can add up.  What keeps you from being active?  If you've had a hard time being more active, you're not alone. Maybe you remember being able to do more. Or maybe you've never thought of yourself as being active. It's frustrating when you can't do the things you want. Being more active can help. What's holding you back?  Getting started.  Have a goal, but break it into easy tasks. Small steps build into big accomplishments.  Staying motivated.  If you feel like skipping your activity, remember your goal. Maybe you want to move better and stay independent. Every activity gets you one step closer.  Not feeling your best.  Start with 5 minutes of an activity you enjoy. Prove to yourself you can do it. As you get comfortable, increase your time.  You may not be where you want to be. But you're in the process of getting there. Everyone starts somewhere.  How can you find safe ways to stay active?  Talk with your doctor about any physical challenges you're facing. Make a plan with your doctor if you have a health problem or aren't sure how to get started with activity.  If you're already active, ask your doctor if there is anything you should change to stay safe as your body and health change.  If  you tend to feel dizzy after you take medicine, avoid activity at that time. Try being active before you take your medicine. This will reduce your risk of falls.  If you plan to be active at home, make sure to clear your space before you get started. Remove things like TV cords, coffee tables, and throw rugs. It's safest to have plenty of space to move freely.  The key to getting more active is to take it slow and steady. Try to improve only a little bit at a time. Pick just one area to improve on at first. And if an activity  hurts, stop and talk to your doctor.  Where can you learn more?  Go to [employer] and enter P600 to learn more about "Learning About Being Active as an Older Adult."  Current as of: December 23, 2021  Content Version: 14.1   2006-2024 [employer], Inc[employer].   Care instructions adapted under license by [facility]. If you have questions about a medical condition or this instruction, always ask your healthcare professional. [employer] disclaims any warranty or liability for your use of this information.           Hearing Loss: Care Instructions  Overview     Hearing loss is a sudden or slow decrease in how well you hear. It can range from slight to profound. Permanent hearing loss can occur with aging. It also can happen when you are exposed long-term to loud noise. Examples include listening to loud music, riding motorcycles, or being around other loud machines.  Hearing loss can affect your work and home life. It can make you feel lonely or depressed. You may feel that you have lost your independence. But hearing aids and other devices can help you hear better and feel connected to others.  Follow-up care is a key part of your treatment and safety. Be sure to make and go to all appointments, and call your doctor if you are having problems. It's also a good idea to know your test results and keep a list of the medicines you take.  How can you care for  yourself at home?  Avoid loud noises whenever possible. This helps keep your hearing from getting worse.  Always wear hearing protection around loud noises.  Wear a hearing aid as directed.  A professional can help you pick a hearing aid that will work best for you.  You can also get hearing aids over the counter for mild to moderate hearing loss.  Have hearing tests as your doctor suggests. They can show whether your hearing has changed. Your hearing aid may need to be adjusted.  Use other devices as needed. These may include:  Telephone amplifiers and hearing aids that can connect to a television, stereo, radio, or microphone.  Devices that use lights or vibrations. These alert you to the doorbell, a ringing telephone, or a baby monitor.  Television closed-captioning. This shows the words at the bottom of the screen. Most new TVs can do this.  TTY (text telephone). This lets you type messages back and forth on the telephone instead of talking or listening. These devices are also called TDD. When messages are typed on the keyboard, they are sent over the phone line to a receiving TTY. The message is shown on a monitor.  Use text messaging, social media, and email if it is hard for you to communicate by telephone.  Try to learn a listening technique called speechreading. It is not lipreading. You pay attention to people's gestures, expressions, posture, and tone of voice. These clues can help you understand what a person is saying. Face the person you are talking to, and have them face you. Make sure the lighting is good. You need to see the other person's face clearly.  Think about counseling if you need help to adjust to your hearing loss.  When should you call for help?  Watch closely for changes in your health, and be sure to contact your doctor if:   You think your hearing is getting worse.    You have new symptoms, such as dizziness or  nausea.   Where can you learn more?  Go to  [employer] and enter R798 to learn more about "Hearing Loss: Care Instructions."  Current as of: April 16, 2022  Content Version: 14.1   2006-2024 [employer], [employer].   Care instructions adapted under license by [facility]. If you have questions about a medical condition or this instruction, always ask your healthcare professional. [employer] Incorporated disclaims any warranty or liability for your use of this information.           Learning About Vision Tests  What are vision tests?     The four most common vision tests are visual acuity tests, refraction, visual field tests, and color vision tests.  Visual acuity (sharpness) tests  These tests are used:  To see if you need glasses or contact lenses.  To monitor an eye problem.  To check an eye injury.  Visual acuity tests are done as part of routine exams. You may also have this test when you get your driver's license or apply for some types of jobs.  Visual field tests  These tests are used:  To check for vision loss in any area of your range of vision.  To screen for certain eye diseases.  To look for nerve damage after a stroke, head injury, or other problem that could reduce blood flow to the brain.  Refraction and color tests  A refraction test is done to find the right prescription for glasses and contact lenses.  A color vision test is done to check for color blindness.  Color vision is often tested as part of a routine exam. You may also have this test when you apply for a job where recognizing different colors is important, such as truck driving, Optician, dispensing, or the [employer].  How are vision tests done?  Visual acuity test   You cover one eye at a time.  You read aloud from a wall chart across the room.  You read aloud from a small card that you hold in your hand.  Refraction   You look into a special device.  The device puts lenses of different strengths in front of each eye to see how strong your glasses or  contact lenses need to be.  Visual field tests   Your doctor may have you look through special machines.  Or your doctor may simply have you stare straight ahead while they move a finger into and out of your field of vision.  Color vision test   You look at pieces of printed test patterns in various colors. You say what number or symbol you see.  Your doctor may have you trace the number or symbol using a pointer.  How do these tests feel?  There is very little chance of having a problem from this test. If dilating drops are used for a vision test, they may make the eyes sting and cause a medicine taste in the mouth.  Follow-up care is a key part of your treatment and safety. Be sure to make and go to all appointments, and call your doctor if you are having problems. It's also a good idea to know your test results and keep a list of the medicines you take.  Where can you learn more?  Go to [employer] and enter G551 to learn more about "Learning About Vision Tests."  Current as of: December 23, 2021  Content Version: 14.1   2006-2024 [employer], Inc[employer].   Care instructions adapted under license by [facility]. If you have questions about a medical condition or this instruction, always ask your healthcare professional. [employer] Inc[employer] disclaims any warranty or liability for your use of this information.           Learning About Activities of Daily Living  What are activities of daily living?     Activities of daily living (ADLs) are the basic self-care tasks you do every day. These include eating, bathing, dressing, and moving around.  As you age, and if you have health problems, you may find that it's harder to do some of these tasks. If so, your doctor can suggest ideas that may help.  To measure what kind of help you may need, your doctor will ask how well you are able to do ADLs. Let your doctor know if there are any tasks that you are having trouble doing. This is an  important first step to getting help. And when you have the help you need, you can stay as independent as possible.  How will a doctor assess your ADLs?  Asking about ADLs is part of a routine health checkup your doctor will likely do as you age. Your health check might be done in a doctor's office, in your home, or at a hospital. The goal is to find out if you are having any problems that could make it hard to care for yourself or that make it unsafe for you to be on your own.  To measure your ADLs, your doctor will ask how hard it is for you to do routine tasks. Your doctor may also want to know if you have changed the way you do a task because of a health problem. Your doctor may watch how you:  Walk back and forth.  Keep your balance while you stand or walk.  Move from sitting to standing or from a bed to a chair.  Button or unbutton a [employer] fast streamer.  Remove and put on your shoes.  It's common to feel a little worried or anxious if you find you can't do all the things you used to be able to do. Talking with your doctor about ADLs is a way to make sure you're as safe as possible and able to care for yourself as well as you can. You may want to bring a caregiver, friend, or family member to your checkup. They can help you talk to your doctor.  Follow-up care is a key part of your treatment and safety. Be sure to make and go to all appointments, and call your doctor if you are having problems. It's also a good idea to know your test results and keep a list of the medicines you take.  Current as of: May 13, 2022  Content Version: 14.1   2006-2024 [employer], [employer].   Care instructions adapted under license by [facility]. If you have questions about a medical condition or this instruction, always ask your healthcare professional. [employer], Incorporated disclaims any warranty or liability for your use of this information.           Advance Directives: Care Instructions  Overview  An advance  directive is a legal way to state your wishes at the end of your life. It tells your family and your doctor what to do if you can't say what you want.  There are two main types of advance directives. You can change them any time your wishes change.  Living will.  This form tells your family and your  doctor your wishes about life support and other treatment. The form is also called a declaration.  Medical power of attorney.  This form lets you name a person to make treatment decisions for you when you can't speak for yourself. This person is called a health care agent (health care proxy, health care surrogate). The form is also called a durable power of attorney for health care.  If you do not have an advance directive, decisions about your medical care may be made by a family member, or by a doctor or a judge who doesn't know you.  It may help to think of an advance directive as a gift to the people who care for you. If you have one, they won't have to make tough decisions by themselves.  For more information, including forms for your state, see the [employer] website ([employer]).  Follow-up care is a key part of your treatment and safety. Be sure to make and go to all appointments, and call your doctor if you are having problems. It's also a good idea to know your test results and keep a list of the medicines you take.  What should you include in an advance directive?  Many states have a unique advance directive form. (It may ask you to address specific issues.) Or you might use a universal form that's approved by many states.  If your form doesn't tell you what to address, it may be hard to know what to include in your advance directive. Use the questions below to help you get started.  Who do you want to make decisions about your medical care if you are not able to?  What life-support measures do you want if you have a serious illness that gets worse over time or can't be  cured?  What are you most afraid of that might happen? (Maybe you're afraid of having pain, losing your independence, or being kept alive by machines.)  Where would you prefer to die? (Your home? A hospital? A nursing home?)  Do you want to donate your organs when you die?  Do you want certain religious practices performed before you die?  When should you call for help?  Be sure to contact your doctor if you have any questions.  Where can you learn more?  Go to [employer] and enter R264 to learn more about "Advance Directives: Care Instructions."  Current as of: June 05, 2022  Content Version: 14.1   2006-2024 [employer], Incorporated.   Care instructions adapted under license by [facility]. If you have questions about a medical condition or this instruction, always ask your healthcare professional. Healthwise, Incorporated disclaims any warranty or liability for your use of this information.           A Healthy Heart: Care Instructions  Overview     Coronary artery disease, also called heart disease, occurs when a substance called plaque builds up in the vessels that supply oxygen-rich blood to your heart muscle. This can narrow the blood vessels and reduce blood flow. A heart attack happens when blood flow is completely blocked. A high-fat diet, smoking, and other factors increase the risk of heart disease.  Your doctor has found that you have a chance of having heart disease. A heart-healthy lifestyle can help keep your heart healthy and prevent heart disease. This lifestyle includes eating healthy, being active, staying at a weight that's healthy for you, and not smoking or using tobacco. It also includes taking medicines as directed, managing other  health conditions, and trying to get a healthy amount of sleep.  Follow-up care is a key part of your treatment and safety. Be sure to make and go to all appointments, and call your doctor if you are having problems. It's also a good  idea to know your test results and keep a list of the medicines you take.  How can you care for yourself at home?  Diet   Use less salt when you cook and eat. This helps lower your blood pressure. Taste food before salting. Add only a little salt when you think you need it. With time, your taste buds will adjust to less salt.    Eat fewer snack items, fast foods, canned soups, and other high-salt, high-fat, processed foods.    Read food labels and try to avoid saturated and trans fats. They increase your risk of heart disease by raising cholesterol levels.    Limit the amount of solid fat--butter, margarine, and shortening--you eat. Use olive, peanut, or canola oil when you cook. Bake, broil, and steam foods instead of frying them.    Eat a variety of fruit and vegetables every day. Dark green, deep orange, red, or yellow fruits and vegetables are especially good for you. Examples include spinach, carrots, peaches, and berries.    Foods high in fiber can reduce your cholesterol and provide important vitamins and minerals. High-fiber foods include whole-grain cereals and breads, oatmeal, beans, brown rice, citrus fruits, and apples.    Eat lean proteins. Heart-healthy proteins include seafood, lean meats and poultry, eggs, beans, peas, nuts, seeds, and soy products.    Limit drinks and foods with added sugar. These include candy, desserts, and soda pop.   Heart-healthy lifestyle   If your doctor recommends it, get more exercise. For many people, walking is a good choice. Or you may want to swim, bike, or do other activities. Bit by bit, increase the time you're active every day. Try for at least 30 minutes on most days of the week.    Try to quit or cut back on using tobacco and other nicotine products. This includes smoking and vaping. If you need help quitting, talk to your doctor about stop-smoking programs and medicines. These can increase your chances of quitting for good. Quitting is one of the most  important things you can do to protect your heart. It is never too late to quit. Try to avoid secondhand smoke too.    Stay at a weight that's healthy for you. Talk to your doctor if you need help losing weight.    Try to get 7 to 9 hours of sleep each night.    Limit alcohol to 2 drinks a day for men and 1 drink a day for women. Too much alcohol can cause health problems.    Manage other health problems such as diabetes, high blood pressure, and high cholesterol. If you think you may have a problem with alcohol or drug use, talk to your doctor.   Medicines   Take your medicines exactly as prescribed. Call your doctor if you think you are having a problem with your medicine.    If your doctor recommends aspirin, take the amount directed each day. Make sure you take aspirin and not another kind of pain reliever, such as acetaminophen (Tylenol).   When should you call for help?   Call 911 if you have symptoms of a heart attack. These may include:   Chest pain or pressure, or a strange  feeling in the chest.    Sweating.    Shortness of breath.    Pain, pressure, or a strange feeling in the back, neck, jaw, or upper belly or in one or both shoulders or arms.    Lightheadedness or sudden weakness.    A fast or irregular heartbeat.   After you call 911, the operator may tell you to chew 1 adult-strength or 2 to 4 low-dose aspirin. Wait for an ambulance. Do not try to drive yourself.  Watch closely for changes in your health, and be sure to contact your doctor if you have any problems.  Where can you learn more?  Go to [employer] and enter F075 to learn more about "A Healthy Heart: Care Instructions."  Current as of: January 11, 2022  Content Version: 14.1   2006-2024 [employer], [employer].   Care instructions adapted under license by [facility]. If you have questions about a medical condition or this instruction, always ask your healthcare professional. [employer]  disclaims any warranty or liability for your use of this information.      Personalized Preventive Plan for Linda Murray - 02/11/2023  Medicare offers a range of preventive health benefits. Some of the tests and screenings are paid in full while other may be subject to a deductible, co-insurance, and/or copay.    Some of these benefits include a comprehensive review of your medical history including lifestyle, illnesses that may run in your family, and various assessments and screenings as appropriate.    After reviewing your medical record and screening and assessments performed today your provider may have ordered immunizations, labs, imaging, and/or referrals for you.  A list of these orders (if applicable) as well as your Preventive Care list are included within your After Visit Summary for your review.    Other Preventive Recommendations:    A preventive eye exam performed by an eye specialist is recommended every 1-2 years to screen for glaucoma; cataracts, macular degeneration, and other eye disorders.  A preventive dental visit is recommended every 6 months.  Try to get at least 150 minutes of exercise per week or 10,000 steps per day on a pedometer .  Order or download the FREE "Exercise & Physical Activity: Your Everyday Guide" from [employer]. Call 541-676-2525 or search [employer] online.  You need 1200-1500 mg of calcium and 1000-2000 IU of vitamin D per day. It is possible to meet your calcium requirement with diet alone, but a vitamin D supplement is usually necessary to meet this goal.  When exposed to the sun, use a sunscreen that protects against both UVA and UVB radiation with an SPF of 30 or greater. Reapply every 2 to 3 hours or after sweating, drying off with a towel, or swimming.  Always wear a seat belt when traveling in a car. Always wear a helmet when riding a bicycle or motorcycle.

## 2023-02-11 NOTE — Progress Notes (Signed)
Medicare Annual Wellness Visit    Linda Murray is here for Medicare AWV    Assessment & Plan   Medicare annual wellness visit, subsequent  Basal cell carcinoma (BCC), unspecified site  Comments:  Followed by Vaughan Sine every 6 months  Impacted cerumen of right ear  Comments:  not bothering her. Advised OTC debrox  S/P dilatation of esophageal stricture  Comments:  Has upcoming f/u with GI to determine need for ongoing PPI use. Denies symptoms of reflux  Need for Tdap vaccination  Comments:  Advised to get at pharmcy  Need for shingles vaccine  Comments:  advsied to get at pharmacy  Age related osteoporosis, unspecified pathological fracture presence  Comments:  Followed by Osteoporosis clinic. Advsied to call and schedule BD.  Orders:  -     Basic Metabolic Panel; Future    Recommendations for Preventive Services Due: see orders and patient instructions/AVS.  Recommended screening schedule for the next 5-10 years is provided to the patient in written form: see Patient Instructions/AVS.     Return in 1 year (on 02/11/2024) for Medicare AWV.     Subjective         Health Maintenance:  Flu: Date of Last : gets yearly   Pneumovax Date of Last 08/04/2005  Prevnar13 Date of Last 09/11/2015  Tdap Date of Last NO RECORD ON DHEC   Shingrix: Date of Last NO RECORD ON DHEC   COVID-19 Immunization Vaccination Completed pfizer 11/2021  Cardiovascular Screening Date of Last LIPID  Diabetes Screening Date of Last A1C  Pap/Pelvic Date of Last >65  Colorectal Cancer Screening > 75  Mammography Date of Last > 74  Bone Density Date of Last 12/07/2019  Bone Density Date Due ORDERED   Hepatitis C Screening Date of Last NOT AGE REQUIRED   Lung Cancer Screening: Date of Last  N/A     Basal cell carcinoma-has history of.  Has lesion on right leg that is being monitored by a dermatology.  Son states they want to take it off that would result in a lot of wound care and patient is not interested in this  2. Seizure disorder - Was taken on Dilantin ER  after seeing a neurologist  4. Osteoporosis -  continue prolia per our osteoporosis clinic. Bd due.  States after blood work in January was told to cut back on calcium.   4.  Esophageal dilation -states called to get refill of PPI with GI and they requested she come in for reevaluation.  Was told after dilation that she would need to be on this medication for life.  She has never had any symptoms of reflux    Patient's complete Health Risk Assessment and screening values have been reviewed and are found in Flowsheets. The following problems were reviewed today and where indicated follow up appointments were made and/or referrals ordered.    Positive Risk Factor Screenings with Interventions:    Fall Risk:  Do you feel unsteady or are you worried about falling? : no  2 or more falls in past year?: (!) yes  Fall with injury in past year?: no     Interventions:    Reviewed medications, home hazards, visual acuity, and co-morbidities that can increase risk for falls  See AVS for additional education material             Inactivity:  On average, how many days per week do you engage in moderate to strenuous exercise (like a brisk walk)?: 0 days Marland Kitchen)  Abnormal  On average, how many minutes do you engage in exercise at this level?: 0 min  Interventions:  See AVS for additional education material       Hearing Screen:  Do you or your family notice any trouble with your hearing that hasn't been managed with hearing aids?: (!) Yes    Interventions:  Denies hearing issues    Vision Screen:  Do you have difficulty driving, watching TV, or doing any of your daily activities because of your eyesight?: No  Have you had an eye exam within the past year?: (!) No  Interventions:   Patient encouraged to make appointment with their eye specialist     ADL's:   Patient reports needing help with:  Select all that apply: (!) Laundry, Housekeeping, Banking/Finances, Shopping  Interventions:  See AVS for additional education  material    Advanced Directives:  Do you have a Living Will?: (!) No    Intervention:  has NO advanced directive - information provided                     Objective   Vitals:    02/11/23 1216   BP: 117/67   Site: Left Upper Arm   Position: Sitting   Cuff Size: Small Adult   Pulse: 82   Resp: 16   SpO2: 95%   Weight: 45.8 kg (101 lb)   Height: 1.524 m (5')      Body mass index is 19.73 kg/m.           GEN: Alertd and oriented and pleasant.no acute distress. Appropriate affect.  Accompanied by son         HEAD: normocephalic, atraumatic.           NECK:  neck supple, no thyromegaly.        HEART: no murmurs,normal rate and regular rhythm,S1, S2 normal, no le edema.           LUNGS: clear to auscultation bilaterally.   SKIN: raised scabby black lesion right anterior leg   MUSCULOSKELETAL: Sitting in wheelchair          NEUROLOGIC:  nonfocal, alert and oriented, cognitive exam grossly normal, cooperative with exam,, wheelchair bound. Brace on right lower leg           PSYCH: alert, oriented, cognitive function intact, judgment and insight good, mood/affect appropriate      No Known Allergies  Prior to Visit Medications    Medication Sig Taking? Authorizing Provider   omeprazole (PRILOSEC) 40 MG delayed release capsule TAKE 1 CAPSULE BY MOUTH EVERY MORNING FOR REFLUX Yes [provider]   calcium carb-cholecalciferol (CALTRATE 600+D3) 600-20 MG-MCG TABS   1 tabs, Oral, BID, # 60 tabs, 0 Refill(s) Yes [provider]   nystatin-triamcinolone (MYCOLOG II) 100000-0.1 UNIT/GM-% cream Apply topically 2 times daily. Yes Vanita Ingles, MD       CareTeam (Including outside providers/suppliers regularly involved in providing care):   Patient Care Team:  Vanita Ingles, MD as PCP - General  Pasty Spillers Philmore Pali, MD as PCP - Empaneled Provider      Reviewed and updated this visit:  Allergies  Meds  Problems

## 2023-05-11 NOTE — Progress Notes (Signed)
[facility]   Established Patient Note  Roney Jaffe, PA-C    HISTORY  Linda Murray is an 82 y.o. female seen for continued osteoporosis management.    Falls since last visit:  Denies  Fractures since last visit: Denies  Medication adverse effects:  none reported  Getting adequate calcium and vitamin D: no changes reported  Weight bearing exercise: wheelchair bound    RELEVANT REVIEW OF SYSTEMS IS NOTED IN THE HPI    No Known Allergies    Current Outpatient Medications   Medication Sig Dispense Refill    omeprazole (PRILOSEC) 40 MG delayed release capsule TAKE 1 CAPSULE BY MOUTH EVERY MORNING FOR REFLUX      calcium carb-cholecalciferol (CALTRATE 600+D3) 600-20 MG-MCG TABS   1 tabs, Oral, BID, # 60 tabs, 0 Refill(s)      nystatin-triamcinolone (MYCOLOG II) 100000-0.1 UNIT/GM-% cream Apply topically 2 times daily. 1 each 0     Current Facility-Administered Medications   Medication Dose Route Frequency Provider Last Rate Last Admin    denosumab (PROLIA) SC injection 60 mg  60 mg SubCUTAneous Once            Past Medical History:   Diagnosis Date    Brain aneurysm     (aphasia, hemiparesis right side).    CKD (chronic kidney disease), stage II     Hyperlipidemia     Osteoporosis     Seizure (HCC)     Urinary incontinence         Past Surgical History:   Procedure Laterality Date    BRAIN SURGERY         Social History     Socioeconomic History    Marital status: Widowed     Spouse name: Not on file    Number of children: Not on file    Years of education: Not on file    Highest education level: Not on file   Occupational History    Not on file   Tobacco Use    Smoking status: Former     Types: Cigarettes     Passive exposure: Never    Smokeless tobacco: Never   Substance and Sexual Activity    Alcohol use: Never    Drug use: Never    Sexual activity: Not on file   Other Topics Concern    Not on file   Social History Narrative    Not on file     Social Determinants of Health      Financial Resource Strain: Not on file   Food Insecurity: Not on file   Transportation Needs: Not on file   Physical Activity: Inactive (02/11/2023)    Exercise Vital Sign     Days of Exercise per Week: 0 days     Minutes of Exercise per Session: 0 min   Stress: Not on file   Social Connections: Not on file   Intimate Partner Violence: Not on file   Housing Stability: Not on file        VITALS  Wt Readings from Last 1 Encounters:   05/18/23 45.8 kg (101 lb)     Ht Readings from Last 1 Encounters:   05/18/23 1.524 m (5')       PHYSICAL EXAM  Constitutional: well-groomed, no acute distress, Body mass index is 19.73 kg/m.  Psychiatric: normal affect, oriented to place, person, and situation  Respiratory: respirations unlabored, on room air  Skin: no rashes, wounds, or lesions seen  on exposed skin    LABS  Lab Results   Component Value Date    BUN 29 (H) 08/11/2022    CREATININE 1.1 (H) 08/11/2022    CALCIUM 11.6 (H) 08/11/2022    ALKPHOS 98 02/03/2020    LABGLOM 50 (L) 08/11/2022    GFRAA 96 02/03/2020       ASSESSMENT    ICD-10-CM    1. Age-related osteoporosis without current pathological fracture  M81.0 Vitamin D 25 Hydroxy     PTH, Intact     Calcium Ionized Serum     denosumab (PROLIA) SC injection 60 mg          PLAN  Osteoporosis ( femoral neck, total hip)  Osteopenia ( spine)  DEXA ( 12/07/19, unable to have safely, unable to get on scanner table)  Fragility fracture ( none)  Fall risk ( high, but is now in a wheelchair)  Prolia (started 2017)    DEXA from 2021, 2019, and 2017 were reviewed. These are listed from least to most recent.  Lumbar T score of -0.7, -0.7, -1.2  Femoral neck T score of -3.3, -3.5 (right hip), -2.6 (left hip)  Total hip T score of -3.3, -3.5 (right hip), -3.0 (left hip).    A few noted items about why she may not be seeing improvement: She had skipped two doses of Prolia, she has become wheelchair bound (disuse atrophy), and she has significant hyperparathyroidism. We have no  comparison in the hip, but assuming symmetry, she is trending in the right direction.    Recent DEXA was re-reviewed and the patient is up to date, or the DEXA was ordered if due.  Any applicable recent labs reviewed. Calcium within one year is normal, or has been corrected since the lab draw if it was low.  10 minutes was spent in the insurance verification for Prolia, and prior authorizations have been obtained if required.  Discontinue calcium and take 1000IU of vitamin D.   Continue weight bearing and core strengthening exercises to target goal of 150 minutes per week to improve bone density and reduce falls.  The skin was prepped with alcohol and the complete 1 mL syringe containing 60 mg/mL dose of denosumab (Prolia) was administered subcutaneously without complication. Please see MAR for specific location, lot number, and expiration date  Advised patient to make me aware of any new falls or fractures that occur before the next scheduled visit.   Follow up in 6 months for next Prolia    10 minutes was spent prior to this visit ensuring that any applicable prior authorizations were completed and up to date, DEXA scan is up to date, and calcium monitoring is up to date.             -Kara Pacer, PA-C  Program Director  [facility]

## 2023-05-18 ENCOUNTER — Inpatient Hospital Stay: Admit: 2023-05-18 | Discharge: 2023-05-18 | Payer: MEDICARE | Primary: Family Medicine

## 2023-05-18 ENCOUNTER — Encounter

## 2023-05-18 ENCOUNTER — Encounter: Admit: 2023-05-18 | Discharge: 2023-05-18 | Payer: MEDICARE | Attending: Medical | Primary: Family Medicine

## 2023-05-18 DIAGNOSIS — M810 Age-related osteoporosis without current pathological fracture: Secondary | ICD-10-CM

## 2023-05-18 LAB — BASIC METABOLIC PANEL
Anion Gap: 9 mmol/L (ref 2–17)
BUN: 22 mg/dL (ref 8–23)
CO2: 27 mmol/L (ref 22–29)
Calcium: 10.4 mg/dL (ref 8.5–10.7)
Chloride: 105 mmol/L (ref 98–107)
Creatinine: 1 mg/dL (ref 0.5–1.0)
Est, Glom Filt Rate: 56 mL/min/1.73mÂ² — ABNORMAL LOW (ref >=60)
Glucose: 82 mg/dL (ref 70–99)
Osmolaliy Calculated: 283 mosm/kg (ref 270–287)
Potassium: 4.2 mmol/L (ref 3.5–5.3)
Sodium: 141 mmol/L (ref 135–145)

## 2023-05-18 LAB — CALCIUM, IONIZED: Calcium, Ionized: 5.1 mg/dL (ref 4.5–5.3)

## 2023-05-18 MED ORDER — DENOSUMAB 60 MG/ML SC SOSY
60 | Freq: Once | SUBCUTANEOUS | Status: AC
Start: 2023-05-18 — End: 2023-05-18
  Administered 2023-05-18: 17:00:00 60 mg via SUBCUTANEOUS

## 2023-05-18 NOTE — Other (Signed)
 All osteoporosis labs were normal.     Thanks,   Orlie Dakin, PA-C  [facility] Program Director

## 2023-05-19 LAB — PTH, INTACT: Pth Intact: 30 pg/mL (ref 15.00–65.00)

## 2023-05-19 LAB — VITAMIN D 25 HYDROXY: Vit D, 25-Hydroxy: 56.2 ng/mL (ref 30.0–90.0)

## 2023-11-23 ENCOUNTER — Ambulatory Visit: Admit: 2023-11-23 | Discharge: 2023-11-23 | Payer: MEDICARE | Attending: Medical | Primary: Family Medicine

## 2023-11-23 DIAGNOSIS — M810 Age-related osteoporosis without current pathological fracture: Secondary | ICD-10-CM

## 2023-11-23 MED ORDER — DENOSUMAB 60 MG/ML SC SOSY
60 | Freq: Once | SUBCUTANEOUS | Status: AC
Start: 2023-11-23 — End: 2023-11-23
  Administered 2023-11-23: 17:00:00 60 mg via SUBCUTANEOUS

## 2023-11-23 NOTE — Progress Notes (Signed)
 [facility]   Established Patient Note  Wyman Heart, PA-C    HISTORY  Linda Murray is an 83 y.o. female seen for continued osteoporosis management.    Falls since last visit:  Denies  Fractures since last visit: Denies  Medication adverse effects:  none reported  Getting adequate calcium and vitamin D: no changes reported  Weight bearing exercise: wheelchair bound    RELEVANT REVIEW OF SYSTEMS IS NOTED IN THE HPI    No Known Allergies    Current Outpatient Medications   Medication Sig Dispense Refill    omeprazole (PRILOSEC) 40 MG delayed release capsule TAKE 1 CAPSULE BY MOUTH EVERY MORNING FOR REFLUX      calcium carb-cholecalciferol (CALTRATE 600+D3) 600-20 MG-MCG TABS   1 tabs, Oral, BID, # 60 tabs, 0 Refill(s)      nystatin -triamcinolone  (MYCOLOG II) 100000-0.1 UNIT/GM-% cream Apply topically 2 times daily. 1 each 0     No current facility-administered medications for this visit.       Past Medical History:   Diagnosis Date    Brain aneurysm     (aphasia, hemiparesis right side).    CKD (chronic kidney disease), stage II     Hyperlipidemia     Osteoporosis     Seizure (HCC)     Urinary incontinence         Past Surgical History:   Procedure Laterality Date    BRAIN SURGERY         Social History     Socioeconomic History    Marital status: Widowed     Spouse name: Not on file    Number of children: Not on file    Years of education: Not on file    Highest education level: Not on file   Occupational History    Not on file   Tobacco Use    Smoking status: Former     Types: Cigarettes     Passive exposure: Never    Smokeless tobacco: Never   Substance and Sexual Activity    Alcohol use: Never    Drug use: Never    Sexual activity: Not on file   Other Topics Concern    Not on file   Social History Narrative    Not on file     Social Drivers of Health     Financial Resource Strain: Not on file   Food Insecurity: Not on file   Transportation Needs: Not on file   Physical  Activity: Inactive (02/11/2023)    Exercise Vital Sign     Days of Exercise per Week: 0 days     Minutes of Exercise per Session: 0 min   Stress: Not on file   Social Connections: Not on file   Intimate Partner Violence: Not on file   Housing Stability: Not on file        VITALS  Wt Readings from Last 1 Encounters:   11/23/23 45.8 kg (101 lb)     Ht Readings from Last 1 Encounters:   11/23/23 1.524 m (5')       PHYSICAL EXAM  Constitutional: well-groomed, no acute distress, Body mass index is 19.73 kg/m.  Psychiatric: normal affect, oriented to place, person, and situation  Respiratory: respirations unlabored, on room air  Skin: no rashes, wounds, or lesions seen on exposed skin    LABS  Lab Results   Component Value Date    BUN 22 05/18/2023    CREATININE 1.0  05/18/2023    CALCIUM 10.4 05/18/2023    ALKPHOS 98 02/03/2020    LABGLOM 56 (L) 05/18/2023    GFRAA 96 02/03/2020       ASSESSMENT    ICD-10-CM    1. Age-related osteoporosis without current pathological fracture  M81.0           PLAN  Osteoporosis ( femoral neck, total hip)  Osteopenia ( spine)  DEXA ( 12/07/19, unable to have safely, unable to get on scanner table)  Fragility fracture ( none)  Fall risk ( high, but is now in a wheelchair)  Prolia  (started 2017, [employer] PROLIA  AUTH APPROVED from 05/12/23 to 05/11/24 )    DEXA from 2021, 2019, and 2017 were reviewed. These are listed from least to most recent.  Lumbar T score of -0.7, -0.7, -1.2  Femoral neck T score of -3.3, -3.5 (right hip), -2.6 (left hip)  Total hip T score of -3.3, -3.5 (right hip), -3.0 (left hip).    A few noted items about why she may not be seeing improvement: She had skipped two doses of Prolia , she has become wheelchair bound (disuse atrophy), and she has significant hyperparathyroidism. We have no comparison in the hip, but assuming symmetry, she is trending in the right direction.    Recent DEXA was re-reviewed and the patient is up to date, or the DEXA was ordered if due.  Any  applicable recent labs reviewed. Calcium within one year is normal, or has been corrected since the lab draw if it was low.  10 minutes was spent in the insurance verification for Prolia , and prior authorizations have been obtained if required.  Discontinue calcium and take 1000IU of vitamin D.   Continue weight bearing and core strengthening exercises to target goal of 150 minutes per week to improve bone density and reduce falls.  The skin was prepped with alcohol and the complete 1 mL syringe containing 60 mg/mL dose of denosumab  (Prolia ) was administered subcutaneously without complication. Please see MAR for specific location, lot number, and expiration date  Advised patient to make me aware of any new falls or fractures that occur before the next scheduled visit.   Follow up in 6 months for next Prolia     10 minutes was spent prior to this visit ensuring that any applicable prior authorizations were completed and up to date, DEXA scan is up to date, and calcium monitoring is up to date.             -Edie Goon, PA-C  Program Director  [facility]

## 2024-02-10 NOTE — Progress Notes (Signed)
 Medicare Annual Wellness Visit    Linda Murray is here for Medicare AWV    Assessment & Plan  1. Health maintenance.  - Due for influenza and COVID-19 booster vaccines, recommended to be administered concurrently in October or November 2025.  - Declined shingles vaccine.  - Last tetanus vaccine was administered in 2011; will receive Tdap vaccine today.  - Flu and COVID vaccines planned for October/November 2025.    2. Basal cell carcinoma.  - Lesion is being monitored; no significant changes noted.  - Lesion does not bother her; no intervention planned due to less than 1% chance of spreading.  - Quality of life considered in decision-making.  - Continues to monitor for any significant changes.    3. Osteoporosis.  - Receiving Prolia  injections every 6 months.  - Bone density test has been ordered for the past 3 years but has not been scheduled.  - Advised to schedule a bone density test to monitor the effectiveness of Prolia  treatment.  - Lab work is done regularly to ensure Prolia  is safe to administer.  Medicare annual wellness visit, subsequent  Basal cell carcinoma (BCC), unspecified site  -     ESTABLISHED, MOD MDM, 30-39 MIN [00785]  Age related osteoporosis, unspecified pathological fracture presence  -     ESTABLISHED, MOD MDM, 30-39 MIN [00785]  Menopausal disorder  -     DEXA BONE DENSITY AXIAL SKELETON; Future    Results       Return in 1 year (on 02/10/2025) for Medicare AWV.     Subjective     History of Present Illness    Health Maintenance:  Flu: Date of Last : 2023  Pneumovax Date of Last 08/04/05  Prevnar13 Date of Last 09/11/15  Tdap Date of Last TD 12/12/09  Shingrix: Date of Last ONE DOSE OF ZOSTER COMPLETED , declines   COVID-19 Immunization Vaccination COMPLETED 12/12/21  Cardiovascular Screening Date of Last NO RECORD   Diabetes Screening Date of Last NO RECORD   Pap/Pelvic Date of Last >65  Colorectal Cancer Screening > 75  Mammography Date of Last > 32  Bone Density Date of Last 12/07/2019  Bone  Density Date Due ORDERED   Hepatitis C Screening Date of Last NOT AGE REQUIRED   Lung Cancer Screening: Date of Last  NON SMOKER          Basal cell carcinoma-has history of.  Has lesion on right leg that is being monitored by a dermatology.  Son states they want to take it off that would result in a lot of wound care and patient is not interested in this    2. Seizure disorder - Was taken on Dilantin ER after seeing a neurologist    4. Osteoporosis -  continue prolia  per our osteoporosis clinic. Bd due.      4.  Esophageal dilation -states called to get refill of PPI with GI and they requested she come in for reevaluation.  Was told after dilation that she would need to be on this medication for life.  She has never had any symptoms of reflux      The patient is an 83 year old female who presents for a physical exam. She is accompanied by her daughter.    Her daughter reports that she has been doing well overall, except for some issues related to changing her clothes. She resides at [facility] and has not received the influenza vaccine this year. She has declined the shingles vaccine. She  is not experiencing any pain and maintains good mobility. Her bowel movements are regular.    A lesion on her leg is being monitored by her son and does not cause her any discomfort. She has not seen a dermatologist recently for this issue. The patient was informed that there is less than 1% chance of it spreading.    She received a Prolia  injection a few months ago and continues to receive these every six months. She undergoes lab work annually but is unsure about the frequency of her bone density tests. At one point, her calcium levels were elevated, leading to a reduction in her calcium intake.    Social History:  Living Condition: Resides at [facility]    Patient's complete Health Risk Assessment and screening values have been reviewed and are found in Flowsheets. The following problems were reviewed today and where  indicated follow up appointments were made and/or referrals ordered.    Positive Risk Factor Screenings with Interventions:               Interventions:  See AVS for additional education material        Vision Screen:  Do you have difficulty driving, watching TV, or doing any of your daily activities because of your eyesight?: No  Have you had an eye exam within the past year?: (!) No                     Objective   Vitals:    02/11/24 1107   BP: 122/74   BP Site: Left Upper Arm   Patient Position: Sitting   BP Cuff Size: Small Adult   Pulse: 88   Resp: 16   SpO2: 97%   Height: 1.524 m (5')      Body mass index is 19.73 kg/m.        Physical Exam  GENERAL APPEARANCE: well developed, well nourished, Patient is alert and oriented X 3. No acute distress. Appropriate affect.. Accompanied bu son           HEAD: normocephalic, atraumatic.           EYES:  well perfused subconjunctiva.                  LYMPH NODES:  no cervical or supraclavicular adenopathy.           HEART: no murmurs,normal rate and regular rhythm,S1, S2 normal, no le edema.           LUNGS: clear to auscultation bilaterally.           ABDOMEN: no hepatosplenomegaly, no masses palpable, soft, nontender, nondistended.   SKIN: right lower leg anteriorly with large scabby lesion          MUSCULOSKELETAL:  sitting in wheelchair brace on right foot , + kyphosis          NEUROLOGIC:  nonfocal, alert and oriented, cognitive exam grossly normal, cooperative with exam,          PSYCH: alert, oriented, cognitive function intact, judgment and insight good, mood/affect appropriate                 No Known Allergies  Prior to Visit Medications    Medication Sig Taking? Authorizing Provider   Multiple Vitamins-Minerals (CENTRUM/CERTA-VITE WITH MINERALS ORAL) solution Take 15 mLs by mouth daily Yes [provider]   omeprazole (PRILOSEC) 40 MG delayed release capsule TAKE 1 CAPSULE BY MOUTH EVERY MORNING FOR REFLUX Yes [provider]   calcium  carb-cholecalciferol (CALTRATE 600+D3) 600-20 MG-MCG TABS   1 tabs, Oral, BID, # 60 tabs, 0 Refill(s) Yes [provider]       CareTeam (Including outside providers/suppliers regularly involved in providing care):   Patient Care Team:  Allena Comer RAMAN, MD as PCP - General  Allena Comer RAMAN, MD as PCP - Empaneled Provider     Recommendations for Preventive Services Due: see orders and patient instructions/AVS.  Recommended screening schedule for the next 5-10 years is provided to the patient in written form: see Patient Instructions/AVS.     Reviewed and updated this visit:  Allergies  Meds               The patient (or guardian, if applicable) and other individuals in attendance with the patient were advised that Artificial Intelligence will be utilized during this visit to record and process the conversation to generate a clinical note. The patient (or guardian, if applicable) and other individuals in attendance at the appointment consented to the use of AI, including the recording.

## 2024-02-10 NOTE — Patient Instructions (Signed)
 A Healthy Heart: Care Instructions  Overview     Coronary artery disease, also called heart disease, occurs when a substance called plaque builds up in the vessels that supply oxygen-rich blood to your heart muscle. This can narrow the blood vessels and reduce blood flow. A heart attack happens when blood flow is completely blocked. A high-fat diet, smoking, and other factors increase the risk of heart disease.  Your doctor has found that you have a chance of having heart disease. A heart-healthy lifestyle can help keep your heart healthy and prevent heart disease. This lifestyle includes eating healthy, being active, staying at a weight that's healthy for you, and not smoking or using tobacco. It also includes taking medicines as directed, managing other health conditions, and trying to get a healthy amount of sleep.  Follow-up care is a key part of your treatment and safety. Be sure to make and go to all appointments, and call your doctor if you are having problems. It's also a good idea to know your test results and keep a list of the medicines you take.  How can you care for yourself at home?  Diet    Use less salt when you cook and eat. This helps lower your blood pressure. Taste food before salting. Add only a little salt when you think you need it. With time, your taste buds will adjust to less salt.     Eat fewer snack items, fast foods, canned soups, and other high-salt, high-fat, processed foods.     Read food labels and try to avoid saturated and trans fats. They increase your risk of heart disease by raising cholesterol levels.     Limit the amount of solid fat--butter, margarine, and shortening--you eat. Use olive, peanut, or canola oil when you cook. Bake, broil, and steam foods instead of frying them.     Eat a variety of fruit and vegetables every day. Dark green, deep orange, red, or yellow fruits and vegetables are especially good for you. Examples include spinach, carrots, peaches, and  berries.     Foods high in fiber can reduce your cholesterol and provide important vitamins and minerals. High-fiber foods include whole-grain cereals and breads, oatmeal, beans, brown rice, citrus fruits, and apples.     Eat lean proteins. Heart-healthy proteins include seafood, lean meats and poultry, eggs, beans, peas, nuts, seeds, and soy products.     Limit drinks and foods with added sugar. These include candy, desserts, and soda pop.   Heart-healthy lifestyle    If your doctor recommends it, get more exercise. For many people, walking is a good choice. Or you may want to swim, bike, or do other activities. Bit by bit, increase the time you're active every day. Try for at least 30 minutes on most days of the week.     Try to quit or cut back on using tobacco and other nicotine products. This includes smoking and vaping. If you need help quitting, talk to your doctor about stop-smoking programs and medicines. These can increase your chances of quitting for good. Quitting is one of the most important things you can do to protect your heart. It is never too late to quit. Try to avoid secondhand smoke too.     Stay at a weight that's healthy for you. Talk to your doctor if you need help losing weight.     Try to get 7 to 9 hours of sleep each night.     Limit alcohol to 2  drinks a day for men and 1 drink a day for women. Too much alcohol can cause health problems.     Manage other health problems such as diabetes, high blood pressure, and high cholesterol. If you think you may have a problem with alcohol or drug use, talk to your doctor.   Medicines    Take your medicines exactly as prescribed. Call your doctor if you think you are having a problem with your medicine.     If your doctor recommends aspirin, take the amount directed each day. Make sure you take aspirin and not another kind of pain reliever, such as acetaminophen (Tylenol).   When should you call for help?   Call 911 if you have symptoms of a heart  attack. These may include:    Chest pain or pressure, or a strange feeling in the chest.     Sweating.     Shortness of breath.     Pain, pressure, or a strange feeling in the back, neck, jaw, or upper belly or in one or both shoulders or arms.     Lightheadedness or sudden weakness.     A fast or irregular heartbeat.   After you call 911, the operator may tell you to chew 1 adult-strength or 2 to 4 low-dose aspirin. Wait for an ambulance. Do not try to drive yourself.  Watch closely for changes in your health, and be sure to contact your doctor if you have any problems.  Where can you learn more?  Go to [employer] and enter F075 to learn more about A Healthy Heart: Care Instructions.  Current as of: February 18, 2023  Content Version: 14.5   [address], [place].   Care instructions adapted under license by [facility]. If you have questions about a medical condition or this instruction, always ask your healthcare professional. [name], [facility], disclaims any warranty or liability for your use of this information.    Personalized Preventive Plan for Linda Murray - 02/11/2024  Medicare offers a range of preventive health benefits. Some of the tests and screenings are paid in full while other may be subject to a deductible, co-insurance, and/or copay.  Some of these benefits include a comprehensive review of your medical history including lifestyle, illnesses that may run in your family, and various assessments and screenings as appropriate.  After reviewing your medical record and screening and assessments performed today your provider may have ordered immunizations, labs, imaging, and/or referrals for you.  A list of these orders (if applicable) as well as your Preventive Care list are included within your After Visit Summary for your review.

## 2024-02-11 ENCOUNTER — Ambulatory Visit
Admit: 2024-02-11 | Discharge: 2024-02-11 | Payer: Medicare (Managed Care) | Attending: Family Medicine | Primary: Family Medicine

## 2024-02-11 DIAGNOSIS — Z0000 Encounter for general adult medical examination without abnormal findings: Principal | ICD-10-CM

## 2024-02-11 NOTE — Progress Notes (Signed)
 Health Maintenance:  Flu: Date of Last :   Pneumovax Date of Last 08/04/05  Prevnar13 Date of Last 09/11/15  Tdap Date of Last TD 12/12/09  Shingrix: Date of Last ONE DOSE OF ZOSTER COMPLETED   COVID-19 Immunization Vaccination COMPLETED 12/12/21  Cardiovascular Screening Date of Last NO RECORD   Diabetes Screening Date of Last NO RECORD   Pap/Pelvic Date of Last >65  Colorectal Cancer Screening > 75  Mammography Date of Last > 46  Bone Density Date of Last 12/07/2019  Bone Density Date Due ORDERED   Hepatitis C Screening Date of Last NOT AGE REQUIRED   Lung Cancer Screening: Date of Last  NON SMOKER

## 2024-05-04 ENCOUNTER — Ambulatory Visit: Admit: 2024-05-04 | Discharge: 2024-05-04 | Payer: MEDICARE | Attending: Foot Surgery | Primary: Family Medicine

## 2024-05-04 DIAGNOSIS — B351 Tinea unguium: Principal | ICD-10-CM

## 2024-05-04 MED ORDER — KETOCONAZOLE 2 % EX CREA
2 | CUTANEOUS | 1 refills | Status: AC
Start: 2024-05-04 — End: ?

## 2024-05-04 NOTE — Progress Notes (Signed)
 "  Department of orthopedics  DR. REDELL JUDITHANN SHOWN   Podiatric surgeon  ABPS: Board certified in foot surgery  ABMSP: Board certified in treatment of diabetic feet      Chief Complaint   Patient presents with    New Patient     ROOM:6 SJ  DESCRIPTION WHY PATIENT IS HERE:   DIABETES: Does NOT have Diabetes  PCP and last appointment with PCP: May 2025  SIDE: Bilateral Feet   LOCATION: Routine footcare and Porokeratosis-  DURATION: A month   Previous treatment includes: None reported,  Radiographs: Did not order Radiographs         Summary of the previous visit is as follows:  Visit: 01/05/2019  1. Plantar flexed metatarsal bone of right foot -  2. Acquired keratoderma - sub metatarsal   3. Fat pad atrophy of foot -    4. Difficulty in walking, not elsewhere classified -       Chief Complaint   Patient presents with    New Patient     ROOM:6 SJ  DESCRIPTION WHY PATIENT IS HERE:   DIABETES: Does NOT have Diabetes  PCP and last appointment with PCP: May 2025  SIDE: Bilateral Feet   LOCATION: Routine footcare and Porokeratosis-  DURATION: A month   Previous treatment includes: None reported,  Radiographs: Did not order Radiographs         Date of last seen: Visit date not found     HPI:        This 83 y.o. female presents with:  - Thick, elongated and discolored toenails that are difficult to cut  - Dry skin that is peeling on the feet.  - Evaluation of feet.      Past Medical History:   Diagnosis Date    Brain aneurysm     (aphasia, hemiparesis right side).    CKD (chronic kidney disease), stage II     Hyperlipidemia     Osteoporosis     Seizure (HCC)     Urinary incontinence        Current Outpatient Medications on File Prior to Visit   Medication Sig Dispense Refill    Multiple Vitamins-Minerals (CENTRUM/CERTA-VITE WITH MINERALS ORAL) solution Take 15 mLs by mouth daily      omeprazole  (PRILOSEC) 40 MG delayed release capsule TAKE 1 CAPSULE BY MOUTH EVERY MORNING FOR REFLUX      calcium carb-cholecalciferol (CALTRATE  600+D3) 600-20 MG-MCG TABS   1 tabs, Oral, BID, # 60 tabs, 0 Refill(s)       No current facility-administered medications on file prior to visit.       No Known Allergies     ROS:    General/Constitutional:  Fever denies. Chills denies. Night sweats denies. Shakes denies.      Endocrine:  Diabetes denies      Musculoskeletal:  Instability admits. Joint stiffness denies. Swollen joints denies. Joint stiffness admits.      Skin:  Color change admits. Nail changes admits.      Neurologic:  Numbness or tingling of the limbs admits. Numbness admits.          EXAMINATION:     Musculoskeletal: Dropfoot lower left extremity with atrophy noted on the lower right extremity has been utilizing the AFO effectively.  There is good muscle strength on the left lower extremity    Neurologic: With use of the Simms Weinstein monofilament protective sensation is intact bilaterally. Vibratory sensation intact proprioception intact bilaterally and symmetrically. With  percussion along the tarsal tunnel there is no evidence of pins-and-needles sensation.     Vascular: On examination dorsalis pedis are non-palpable and posterior tibial arteries are palpable bilaterally. Capillary fill time is less than three seconds in all digits bilaterally. Skin temperature is cool to cool for to the tibial tuberosities to toes.     Orthopedic: There is decreased range of motion of the first metatarsophalangeal joint. and the subtalar joint in bilateral feet. No discomfort is noted on manipulation of the pedal joints. Otherwise range of motion is within normal limits for patient's age and activity level.     Dermatology: Toenails 1-10 on examination are yellow thick dystrophic was subungual debris consistent with onychomycosis. Skin is dark pigmented, atrophic and void of hair bilaterally. Interdigitally there is evidence dry flaky, fissuring of the skin which is consistent with dermatophytosis. Patient has difficulty ambulating secondary to elongated  thickened toenails.      ASSESSMENT & PLAN:   Dermatophytosis of nail   Educate patient regarding onychomycosis and discuss treatment options   Debride toenails 1-10 both in length and thickness with the use of a nail nipper and mechanized grinding file       Dermatophytosis of foot   Prescribe ketoconazole  cream apply twice daily  Educated the son on using Lysol inside the shoes and on the brace    Difficulty in walking    Inspected patients shoes and brace continue and AFO appears to be effective  Answered all questions the patient satisfaction     Foot Education   Review proper foot care following:  Encouraged patient what they should be doing on a routine basis and how to prevent foot problems.    Evaluating the feet daily  Drying between the toes after bating  Not walking barefoot  Moisturize his feet regularly avoiding applying between the toes    A total of 35 minutes were spent on this case on the day of the encounter, including preparation see the patient, reviewing studies, meeting with the patient, discussing plan of care, orders, and documentation.     Diagnosis Orders   1. Onychomycosis        2. Dry skin dermatitis        3. Dermatophytosis        4. Foot drop, right               Electronically signed by Redell KANDICE Shown, DPM on 05/04/2024 at 12:20 PM    "

## 2024-06-02 ENCOUNTER — Encounter

## 2024-06-02 LAB — CALCIUM: Calcium: 10.6 mg/dL (ref 8.5–10.7)

## 2024-06-02 LAB — PTH, INTACT: Pth Intact: 33.9 pg/mL (ref 15.00–65.00)

## 2024-06-02 LAB — VITAMIN D 25 HYDROXY: Vit D, 25-Hydroxy: 53.8 ng/mL (ref 30.0–90.0)

## 2024-06-02 NOTE — Telephone Encounter (Signed)
"  Spoke with patients son Myer about patient still needing to get labs completed - he stated that he will try to get her there today to get them done  "

## 2024-06-06 ENCOUNTER — Encounter: Payer: Medicare (Managed Care) | Attending: Medical | Primary: Family Medicine

## 2024-06-06 ENCOUNTER — Ambulatory Visit
Admit: 2024-06-06 | Discharge: 2024-06-06 | Payer: Medicare (Managed Care) | Attending: Acute Care | Primary: Family Medicine

## 2024-06-06 VITALS — Ht 60.0 in | Wt 95.0 lb

## 2024-06-06 DIAGNOSIS — M810 Age-related osteoporosis without current pathological fracture: Principal | ICD-10-CM

## 2024-06-06 MED ORDER — DENOSUMAB 60 MG/ML SC SOSY
60 | Freq: Once | SUBCUTANEOUS | Status: AC
Start: 2024-06-06 — End: 2024-06-06
  Administered 2024-06-06: 17:00:00 60 mg via SUBCUTANEOUS

## 2024-06-06 NOTE — Progress Notes (Signed)
 "  Osteoporosis Clinic     Linda Murray is a 83 y.o. female seen in clinic today for follow up of osteoporosis and fracture risk, presenting for routine Prolia  injection.     Recent falls or fractures: No  Adverse effects/medication side effects: No  Current smoking or alcohol use >2 drinks/day: No    All relevant systems reviewed and negative except pertinent positives in HPI above.     Current Outpatient Medications   Medication Sig Dispense Refill    ketoconazole  (NIZORAL ) 2 % cream Apply topically twice a day 60 g 1    Multiple Vitamins-Minerals (CENTRUM/CERTA-VITE WITH MINERALS ORAL) solution Take 15 mLs by mouth daily      omeprazole  (PRILOSEC) 40 MG delayed release capsule TAKE 1 CAPSULE BY MOUTH EVERY MORNING FOR REFLUX      calcium carb-cholecalciferol (CALTRATE 600+D3) 600-20 MG-MCG TABS   1 tabs, Oral, BID, # 60 tabs, 0 Refill(s)       No current facility-administered medications for this visit.       Vitals:    06/06/24 1200   Weight: 43.1 kg (95 lb)   Height: 1.524 m (5')      Body mass index is 18.55 kg/m.    Physical Exam  Constitutional:       Appearance: Normal appearance.   HENT:      Head: Normocephalic and atraumatic.      Nose: Nose normal.      Mouth/Throat:      Mouth: Mucous membranes are moist.   Eyes:      Conjunctiva/sclera: Conjunctivae normal.   Pulmonary:      Effort: Pulmonary effort is normal.   Musculoskeletal:      Comments: R wrist brace and L ankle brace for chronic weakness   Skin:     General: Skin is warm and dry.   Neurological:      Mental Status: She is alert and oriented to person, place, and time. Mental status is at baseline.      Gait: Gait is intact.   Psychiatric:         Mood and Affect: Mood normal.         Cognition and Memory: Cognition and memory normal.         Judgment: Judgment normal.       Labs (independently analyzed and reviewed with patient related to osteoporosis)  Lab Results   Component Value Date    CREATININE 1.0 05/18/2023    LABGLOM 56 (L) 05/18/2023     CALCIUM 10.6 06/02/2024    CAION 5.1 05/18/2023    VITD25 53.8 06/02/2024    IPTH 33.90 06/02/2024       Imaging  DXA from 12/07/19 reviewed and results discussed with patient:  Lumbar T score of  -1.2, Previous T-Score: -0.7, interval change: -6.0%    Total hip T score of  -2.6  Femoral neck T score of  -3.0   COMPARISON- 12/09/2016       Assessment/Plan  Osteoporosis, age-related (females and males over 70 due to aging skeleton/calcium deficiency)  Complicated by: medication (chronic proton pump inhibitor use), CKD, parathyroid disease, and immobilization  Patient meets the criteria for osteoporosis, which is classified by a T score of -2.5 or less, prior fragility fracture, or FRAX score of 20% overall risk for fracture or 3% risk for hip fracture.     Current medical therapy:   -Continue Prolia , started 2017 with last injection 11/23/23  -Calcium level appropriate prior to  administration of denosumab  60mg  subcutaneous injection today   -Plan for next Prolia  injection in 6 months  Administrations This Visit       denosumab  (PROLIA ) SC injection 60 mg       Admin Date  06/06/2024  12:04 Action  Given Dose  60 mg Route  SubCUTAneous Site  Arm Right Documented By  Maryetta Bernarda HERO, APRN - NP    NDC: [phone]    Lot#: [id number]    Manufacturer: [employer]    Patient Supplied?: No                     Monitoring:   -Unable to obtain DXA d/t inability to get on table.  Orders Placed This Encounter   Procedures    Calcium Ionized Serum     Standing Status:   Future     Expected Date:   11/03/2024     Expiration Date:   06/06/2025       Supplementations and lifestyle modifications:  -Calcium and vitamin D intake, with goal of 1000-1200 mg of calcium and >1000 IU vitamin D daily, which can come from diet, supplementation, or a combination thereof. Continue current supplementation  -At least 150 minutes of weight bearing exercise per week as tolerated.  -Core muscle strengthening, home safety modifications, and appropriate  assistive equipment to help reduce fall risk.      22  minutes were spent reviewing medical records, obtaining history, ordering and/or interpreting labs and diagnostic studies, performing relevant physical exam, forming diagnosis and treatment plan, and providing education to patient and family or caregiver.       This visit inherently serves to provide longitudinal care for this patient's single, serious and complex condition of osteoporosis or osteopenia, requiring a continuous and active plan of care by a sole provider with specialized clinical skill to provide continuity of care in the ongoing processes of medication management, obtaining applicable prior authorizations, ordering and analysis of appropriate follow up labs and imaging, providing patient education and shared decision making around therapeutic goals.       Electronically signed by Bernarda HERO Maryetta, APRN - NP on 06/06/2024 at 12:11 PM  "

## 2024-06-15 LAB — C TELOPEPTIDE: C TELOPEPTIDE: 595 pg/mL

## 2024-06-23 ENCOUNTER — Observation Stay
Admission: EM | Admit: 2024-06-23 | Discharge: 2024-06-24 | Disposition: A | Discharge status: Other Acute Inpatient Hospital | Payer: Medicare (Managed Care) | Attending: Internal Medicine | Admitting: Internal Medicine

## 2024-06-23 DIAGNOSIS — T18128A Food in esophagus causing other injury, initial encounter: Secondary | ICD-10-CM

## 2024-06-23 DIAGNOSIS — E861 Hypovolemia: Secondary | ICD-10-CM

## 2024-06-23 LAB — COMPREHENSIVE METABOLIC PANEL
ALT: 22 U/L (ref 0–42)
AST: 39 U/L (ref 0–46)
Albumin/Globulin Ratio: 1.13 (ref 1.00–2.70)
Albumin: 4.3 g/dL (ref 3.5–5.2)
Alk Phosphatase: 72 U/L (ref 35–117)
Anion Gap: 17 mmol/L (ref 2–17)
BUN: 45 mg/dL — ABNORMAL HIGH (ref 8–23)
CALCIUM,CORRECTED,CCA: 10.7 mg/dL (ref 8.5–10.7)
CO2: 19 mmol/L — ABNORMAL LOW (ref 22–29)
Calcium: 10.9 mg/dL — ABNORMAL HIGH (ref 8.5–10.7)
Chloride: 111 mmol/L — ABNORMAL HIGH (ref 98–107)
Creatinine: 1.7 mg/dL — ABNORMAL HIGH (ref 0.5–1.0)
Est, Glom Filt Rate: 30 mL/min/1.73mÂ² — ABNORMAL LOW (ref >=60)
Globulin: 3.8 g/dL (ref 1.9–4.4)
Glucose: 127 mg/dL — ABNORMAL HIGH (ref 70–99)
Osmolaliy Calculated: 305 mosm/kg — ABNORMAL HIGH (ref 270–287)
Sodium: 147 mmol/L — ABNORMAL HIGH (ref 135–145)
Total Bilirubin: 2.2 mg/dL — ABNORMAL HIGH (ref 0.00–1.20)
Total Protein: 8.1 g/dL (ref 5.7–8.3)

## 2024-06-23 LAB — CBC WITH AUTO DIFFERENTIAL
Basophils %: 0.3 % (ref 0.0–2.0)
Basophils Absolute: 0 x10e3/mcL (ref 0.0–0.2)
Eosinophils %: 0.1 % (ref 0.0–7.0)
Eosinophils Absolute: 0 x10e3/mcL (ref 0.0–0.5)
Hematocrit: 44.8 % (ref 34.0–47.0)
Hemoglobin: 14.5 g/dL (ref 11.5–15.7)
Immature Grans (Abs): 0.07 x10e3/mcL — ABNORMAL HIGH (ref 0.00–0.06)
Immature Granulocytes %: 0.5 % (ref 0.0–0.6)
Lymphocytes Absolute: 1.7 x10e3/mcL (ref 1.0–3.2)
Lymphocytes: 11.6 % — ABNORMAL LOW (ref 15.0–45.0)
MCH: 32.8 pg (ref 27.0–34.5)
MCHC: 32.4 g/dL (ref 30.0–36.0)
MCV: 101.4 fL — ABNORMAL HIGH (ref 81.0–99.0)
MPV: 10 fL (ref 7.0–12.2)
Monocytes %: 3.9 % — ABNORMAL LOW (ref 4.0–12.0)
Monocytes Absolute: 0.6 x10e3/mcL (ref 0.3–1.0)
NRBC Absolute: 0 x10e3/mcL (ref 0.00–0.01)
NRBC Automated: 0 % (ref 0.0–0.2)
Neutrophils %: 83.6 % — ABNORMAL HIGH (ref 42.0–74.0)
Neutrophils Absolute: 12.1 x10e3/mcL — ABNORMAL HIGH (ref 1.6–7.3)
Platelets: 288 x10e3/mcL (ref 140–440)
RBC: 4.42 x10e6/mcL (ref 3.60–5.20)
RDW: 13.5 % (ref 10.0–17.0)
WBC: 14.4 x10e3/mcL — ABNORMAL HIGH (ref 3.8–10.6)

## 2024-06-23 LAB — EKG 12-LEAD
P Axis: 60 degrees
P-R Interval: 152 ms
Q-T Interval: 287 ms
Q-T Interval: 422 ms
QRS Duration: 86 ms
QRS Duration: 78 ms
QTc Calculation (Bazett): 428 ms
QTc Calculation (Bazett): 457 ms
R Axis: 31 degrees
R Axis: 58 degrees
T Axis: 92 degrees
T Axis: 76 degrees
Ventricular Rate: 133 {beats}/min
Ventricular Rate: 79 {beats}/min

## 2024-06-23 LAB — POTASSIUM: Potassium: 4.5 mmol/L (ref 3.5–5.3)

## 2024-06-23 LAB — MAGNESIUM: Magnesium: 2.7 mg/dL — ABNORMAL HIGH (ref 1.6–2.6)

## 2024-06-23 LAB — T4, FREE: T4 Free: 1.64 ng/dL (ref 0.82–1.70)

## 2024-06-23 LAB — TSH: TSH, 3rd Generation: 1.54 u[IU]/mL (ref 0.358–3.740)

## 2024-06-23 MED ORDER — HALOPERIDOL LACTATE 5 MG/ML IJ SOLN
5 | Freq: Once | INTRAMUSCULAR | Status: DC | PRN
Start: 2024-06-23 — End: 2024-06-23

## 2024-06-23 MED ORDER — NORMAL SALINE FLUSH 0.9 % IV SOLN
0.9 | INTRAVENOUS | Status: DC | PRN
Start: 2024-06-23 — End: 2024-06-23

## 2024-06-23 MED ORDER — LORAZEPAM 2 MG/ML IJ SOLN
2 | Freq: Once | INTRAMUSCULAR | Status: DC | PRN
Start: 2024-06-23 — End: 2024-06-23

## 2024-06-23 MED ORDER — DIPHENHYDRAMINE HCL 50 MG/ML IJ SOLN
50 | Freq: Once | INTRAMUSCULAR | Status: DC | PRN
Start: 2024-06-23 — End: 2024-06-23

## 2024-06-23 MED ORDER — LIDOCAINE HCL (PF) 2 % IJ SOLN
2 | INTRAMUSCULAR | Status: AC
Start: 2024-06-23 — End: 2024-06-23

## 2024-06-23 MED ORDER — DIPHENHYDRAMINE HCL 50 MG/ML IJ SOLN
50 | Freq: Once | INTRAMUSCULAR | Status: AC
Start: 2024-06-23 — End: 2024-06-23
  Administered 2024-06-23: 17:00:00 25 mg via INTRAVENOUS

## 2024-06-23 MED ORDER — LABETALOL HCL 5 MG/ML IV SOLN
5 | INTRAVENOUS | Status: DC | PRN
Start: 2024-06-23 — End: 2024-06-23

## 2024-06-23 MED ORDER — PHENYLEPHRINE HCL 1 MG/10ML IV SOSY
1 | INTRAVENOUS | Status: AC
Start: 2024-06-23 — End: 2024-06-23

## 2024-06-23 MED ORDER — SODIUM CHLORIDE 0.9 % IV SOLN
0.9 | INTRAVENOUS | Status: DC | PRN
Start: 2024-06-23 — End: 2024-06-24

## 2024-06-23 MED ORDER — NORMAL SALINE FLUSH 0.9 % IV SOLN
0.9 | Freq: Two times a day (BID) | INTRAVENOUS | Status: DC
Start: 2024-06-23 — End: 2024-06-24
  Administered 2024-06-24 (×2): 10 mL via INTRAVENOUS

## 2024-06-23 MED ORDER — POLYETHYLENE GLYCOL 3350 17 G PO PACK
17 | Freq: Every day | ORAL | Status: DC | PRN
Start: 2024-06-23 — End: 2024-06-24

## 2024-06-23 MED ORDER — PROPOFOL 200 MG/20ML IV EMUL
200 | Freq: Once | INTRAVENOUS | Status: DC | PRN
Start: 2024-06-23 — End: 2024-06-23
  Administered 2024-06-23: 21:00:00 80 via INTRAVENOUS
  Administered 2024-06-23 (×2): 20 via INTRAVENOUS

## 2024-06-23 MED ORDER — ONDANSETRON HCL 4 MG/2ML IJ SOLN
4 | Freq: Once | INTRAMUSCULAR | Status: DC | PRN
Start: 2024-06-23 — End: 2024-06-23

## 2024-06-23 MED ORDER — ONDANSETRON HCL 4 MG/2ML IJ SOLN
4 | Freq: Four times a day (QID) | INTRAMUSCULAR | Status: DC | PRN
Start: 2024-06-23 — End: 2024-06-24

## 2024-06-23 MED ORDER — LACTATED RINGERS IV SOLN
INTRAVENOUS | Status: DC
Start: 2024-06-23 — End: 2024-06-23

## 2024-06-23 MED ORDER — METOCLOPRAMIDE HCL 5 MG/ML IJ SOLN
5 | Freq: Once | INTRAMUSCULAR | Status: AC
Start: 2024-06-23 — End: 2024-06-23
  Administered 2024-06-23: 17:00:00 10 mg via INTRAVENOUS

## 2024-06-23 MED ORDER — PHENYLEPHRINE HCL 1 MG/10ML IV SOSY
1 | Freq: Once | INTRAVENOUS | Status: DC | PRN
Start: 2024-06-23 — End: 2024-06-23
  Administered 2024-06-23 (×2): 200 via INTRAVENOUS
  Administered 2024-06-23: 21:00:00 100 via INTRAVENOUS
  Administered 2024-06-23: 21:00:00 200 via INTRAVENOUS

## 2024-06-23 MED ORDER — ACETAMINOPHEN 325 MG PO TABS
325 | Freq: Four times a day (QID) | ORAL | Status: DC | PRN
Start: 2024-06-23 — End: 2024-06-24

## 2024-06-23 MED ORDER — HYDRALAZINE HCL 20 MG/ML IJ SOLN
20 | INTRAMUSCULAR | Status: DC | PRN
Start: 2024-06-23 — End: 2024-06-23

## 2024-06-23 MED ORDER — ALUM & MAG HYDROXIDE-SIMETH 200-200-20 MG/5ML PO SUSP
200-200-20 | Freq: Four times a day (QID) | ORAL | Status: DC | PRN
Start: 2024-06-23 — End: 2024-06-24

## 2024-06-23 MED ORDER — SODIUM CHLORIDE 0.9 % IV SOLN
0.9 | INTRAVENOUS | Status: DC | PRN
Start: 2024-06-23 — End: 2024-06-23

## 2024-06-23 MED ORDER — PROPOFOL 200 MG/20ML IV EMUL
200 | INTRAVENOUS | Status: AC
Start: 2024-06-23 — End: 2024-06-23

## 2024-06-23 MED ORDER — FENTANYL CITRATE (PF) 100 MCG/2ML IJ SOLN
100 | INTRAMUSCULAR | Status: DC | PRN
Start: 2024-06-23 — End: 2024-06-23

## 2024-06-23 MED ORDER — DILTIAZEM HCL 25 MG/5ML IV SOLN
25 | Freq: Once | INTRAVENOUS | Status: AC
Start: 2024-06-23 — End: 2024-06-23
  Administered 2024-06-23: 18:00:00 20 mg via INTRAVENOUS

## 2024-06-23 MED ORDER — NORMAL SALINE FLUSH 0.9 % IV SOLN
0.9 | Freq: Two times a day (BID) | INTRAVENOUS | Status: DC
Start: 2024-06-23 — End: 2024-06-23

## 2024-06-23 MED ORDER — DILTIAZEM HCL 100 MG IV SOLR
100 | INTRAVENOUS | Status: DC
Start: 2024-06-23 — End: 2024-06-23
  Administered 2024-06-23: 18:00:00 5 mg/h via INTRAVENOUS

## 2024-06-23 MED ORDER — IPRATROPIUM-ALBUTEROL 0.5-2.5 (3) MG/3ML IN SOLN
0.5-2.5 | Freq: Once | RESPIRATORY_TRACT | Status: DC | PRN
Start: 2024-06-23 — End: 2024-06-23

## 2024-06-23 MED ORDER — SODIUM CHLORIDE 0.9 % IV BOLUS
0.9 | Freq: Once | INTRAVENOUS | Status: AC
Start: 2024-06-23 — End: 2024-06-23
  Administered 2024-06-23: 19:00:00 1000 mL via INTRAVENOUS

## 2024-06-23 MED ORDER — LACTATED RINGERS IV SOLN
INTRAVENOUS | Status: DC | PRN
Start: 2024-06-23 — End: 2024-06-23
  Administered 2024-06-23: 20:00:00 via INTRAVENOUS

## 2024-06-23 MED ORDER — ONDANSETRON 4 MG PO TBDP
4 | Freq: Three times a day (TID) | ORAL | Status: DC | PRN
Start: 2024-06-23 — End: 2024-06-24

## 2024-06-23 MED ORDER — LIDOCAINE HCL (PF) 2 % IJ SOLN
2 | Freq: Once | INTRAMUSCULAR | Status: DC | PRN
Start: 2024-06-23 — End: 2024-06-23
  Administered 2024-06-23: 21:00:00 40 via INTRAVENOUS

## 2024-06-23 MED ORDER — SODIUM CHLORIDE 0.9 % IV BOLUS
0.9 | Freq: Once | INTRAVENOUS | Status: AC
Start: 2024-06-23 — End: 2024-06-23
  Administered 2024-06-23: 17:00:00 1000 mL via INTRAVENOUS

## 2024-06-23 MED ORDER — NORMAL SALINE FLUSH 0.9 % IV SOLN
0.9 | INTRAVENOUS | Status: DC | PRN
Start: 2024-06-23 — End: 2024-06-24

## 2024-06-23 MED ORDER — DEXAMETHASONE SODIUM PHOSPHATE 4 MG/ML IJ SOLN
4 | Freq: Once | INTRAMUSCULAR | Status: DC | PRN
Start: 2024-06-23 — End: 2024-06-23
  Administered 2024-06-23: 21:00:00 4 via INTRAVENOUS

## 2024-06-23 MED ORDER — ACETAMINOPHEN 650 MG RE SUPP
650 | Freq: Four times a day (QID) | RECTAL | Status: DC | PRN
Start: 2024-06-23 — End: 2024-06-24

## 2024-06-23 MED ORDER — DILTIAZEM HCL 25 MG/5ML IV SOLN
25 | Freq: Once | INTRAVENOUS | Status: AC
Start: 2024-06-23 — End: 2024-06-23
  Administered 2024-06-23: 17:00:00 10 mg via INTRAVENOUS

## 2024-06-23 MED ORDER — ONDANSETRON HCL 4 MG/2ML IJ SOLN
4 | Freq: Once | INTRAMUSCULAR | Status: DC | PRN
Start: 2024-06-23 — End: 2024-06-23
  Administered 2024-06-23: 21:00:00 4 via INTRAVENOUS

## 2024-06-23 MED ORDER — SODIUM CHLORIDE 0.9 % IV SOLN
0.9 | INTRAVENOUS | Status: DC
Start: 2024-06-23 — End: 2024-06-24
  Administered 2024-06-23 – 2024-06-24 (×2): via INTRAVENOUS

## 2024-06-23 MED ORDER — DEXAMETHASONE SODIUM PHOSPHATE 4 MG/ML IJ SOLN
4 | INTRAMUSCULAR | Status: AC
Start: 2024-06-23 — End: 2024-06-23

## 2024-06-23 MED ORDER — NALOXONE HCL 0.4 MG/ML IJ SOLN
0.4 | INTRAMUSCULAR | Status: DC | PRN
Start: 2024-06-23 — End: 2024-06-23

## 2024-06-23 MED ORDER — ONDANSETRON HCL 4 MG/2ML IJ SOLN
4 | INTRAMUSCULAR | Status: AC
Start: 2024-06-23 — End: 2024-06-23

## 2024-06-23 MED ORDER — HYDROMORPHONE HCL PF 2 MG/ML IJ SOLN
2 | INTRAMUSCULAR | Status: DC | PRN
Start: 2024-06-23 — End: 2024-06-23

## 2024-06-23 MED ORDER — PANTOPRAZOLE SODIUM 40 MG IV SOLR
40 | Freq: Every day | INTRAVENOUS | Status: DC
Start: 2024-06-23 — End: 2024-06-24
  Administered 2024-06-23 – 2024-06-24 (×2): 40 mg via INTRAVENOUS

## 2024-06-23 MED FILL — DIPHENHYDRAMINE HCL 50 MG/ML IJ SOLN: 50 mg/mL | INTRAMUSCULAR | Qty: 1 | Fill #0

## 2024-06-23 MED FILL — DILTIAZEM HCL 100 MG IV SOLR: 100 mg | INTRAVENOUS | Qty: 1 | Fill #0

## 2024-06-23 MED FILL — DEXAMETHASONE SODIUM PHOSPHATE 4 MG/ML IJ SOLN: 4 mg/mL | INTRAMUSCULAR | Qty: 1 | Fill #0

## 2024-06-23 MED FILL — PANTOPRAZOLE SODIUM 40 MG IV SOLR: 40 mg | INTRAVENOUS | Qty: 40 | Fill #0

## 2024-06-23 MED FILL — DIPRIVAN 200 MG/20ML IV EMUL: 200 MG/20ML | INTRAVENOUS | Qty: 20 | Fill #0

## 2024-06-23 MED FILL — ONDANSETRON HCL 4 MG/2ML IJ SOLN: 4 MG/2ML | INTRAMUSCULAR | Qty: 2 | Fill #0

## 2024-06-23 MED FILL — DILTIAZEM HCL 25 MG/5ML IV SOLN: 25 MG/5ML | INTRAVENOUS | Qty: 5 | Fill #0

## 2024-06-23 MED FILL — METOCLOPRAMIDE HCL 5 MG/ML IJ SOLN: 5 mg/mL | INTRAMUSCULAR | Qty: 2 | Fill #0

## 2024-06-23 MED FILL — PHENYLEPHRINE HCL (PRESSORS) 1 MG/10ML IV SOSY: 1 MG/0ML | INTRAVENOUS | Qty: 10 | Fill #0

## 2024-06-23 MED FILL — XYLOCAINE-MPF 2 % IJ SOLN: 2 % | INTRAMUSCULAR | Qty: 5 | Fill #0

## 2024-06-23 NOTE — Procedures (Signed)
"                    [facility]                             PROCEDURE NOTE      PATIENT NAME: Linda Murray, QAZI                  DOB: 12-28-1940  MED REC NO: [id number]                       ROOM: [id number]  ACCOUNT NO: [id number]                       ADMIT DATE: 06/23/2024  PROVIDER: Devona Pippin, MD    DATE OF SERVICE:  06/23/2024    PREOPERATIVE DIAGNOSIS:  Food impaction.    POSTOPERATIVE DIAGNOSIS:       1. Food impaction in the mid esophagus, dislodged with the endoscope.     2. Post impaction ulcer in the mid esophagus.     3. Known obstructive Schatzki ring.     4. Large hiatal hernia with intrathoracic stomach.     5. Fundic gland polyps.     6. Normal duodenum.    PROCEDURE PERFORMED:  Esophagogastroduodenoscopy with disimpaction of food bolus.    SURGEON:  Talin Feister, MD    PROCEDURE:  After anesthesia was administered, an upper diagnostic endoscope was easily inserted through the upper esophageal sphincter into the mid esophagus.  There was a large food bolus impacted in the mid esophagus with an underlying ulceration.  Food was pushed with the endoscope and flushed with water into the stomach.  There was no obvious stricture.  Nonobstructive Schatzki ring was seen at the GE junction.  Intrathoracic stomach was noted on the retroflexed view.  Fundic gland polyps were also seen.  Duodenum was normal.    MEDICATIONS:  General anesthesia.    PLAN:  Full liquid diet today and advance as tolerated.  Liquid Carafate  1 g 4 times a day for 1 week.        Barron Vanloan, MD      NH/AQS  D:  06/23/2024 15:50:31  T:  06/23/2024 21:37:29  JOB #:  [id number]     "

## 2024-06-23 NOTE — ED Notes (Signed)
"  Pt taken to ENDO Lab in care of RN. Report given at bedside.  "

## 2024-06-23 NOTE — H&P (Signed)
 "HISTORY AND PHYSICAL             Date: 06/23/2024        Patient Name: Linda Murray     Date of Birth: August 19, 1940      Age:  83 y.o.    Chief Complaint     Chief Complaint   Patient presents with    Abdominal Pain     Pt c/o abdominal pain and N/V that started yesterday. Pt spitting up water and not eating per son          History Obtained From   patient  History obtained from both patient and her son  History of Present Illness   Patient presented to the emergency room with food impaction and new onset of atrial fibrillation with rapid ventricular rate.  Following successful pharmaceutical cardioversion, EGD with disimpaction of food will be performed    Past Medical History     Past Medical History:   Diagnosis Date    Brain aneurysm     (aphasia, hemiparesis right side).    CKD (chronic kidney disease), stage II     Hyperlipidemia     Osteoporosis     Seizure (HCC)     Urinary incontinence         Past Surgical History     Past Surgical History:   Procedure Laterality Date    BRAIN SURGERY          Medications Prior to Admission     Prior to Admission medications   Medication Sig Start Date End Date Taking? Authorizing Provider   ketoconazole  (NIZORAL ) 2 % cream Apply topically twice a day 05/04/24   Elgin Redell MATSU, DPM   Multiple Vitamins-Minerals (CENTRUM/CERTA-VITE WITH MINERALS ORAL) solution Take 15 mLs by mouth daily    [provider]   omeprazole  (PRILOSEC) 40 MG delayed release capsule TAKE 1 CAPSULE BY MOUTH EVERY MORNING FOR REFLUX 11/06/21   [provider]   calcium carb-cholecalciferol (CALTRATE 600+D3) 600-20 MG-MCG TABS   1 tabs, Oral, BID, # 60 tabs, 0 Refill(s) 10/22/17   [provider]        Allergies   Patient has no known allergies.    Social History     Social History       Tobacco History       Smoking Status  Former Smoking Tobacco Type  Cigarettes      Passive Exposure  Never      Smokeless Tobacco Use  Never              Alcohol History       Alcohol Use  Status  Never              Drug Use       Drug Use Status  Never              Sexual Activity       Sexually Active  Not Asked                    Family History   No family history on file.    Review of Systems   Review of Systems  As above  Physical Exam   BP 112/71   Pulse 75   Temp 98.1 F (36.7 C)   Resp 19   Ht 1.524 m (5')   Wt 43.1 kg (95 lb)   SpO2 96%   BMI 18.55 kg/m     Physical  Exam  NAD, elderly and chronically ill-appearing, alert and oriented, right hemiparesis    Chest clear on percussion auscultation cardiovascular exam regular rate.    Abdomen soft nondistended nontender with normal active bowel sounds without rebound or guarding.      Labs      Recent Results (from the past 24 hours)   CBC with Auto Differential    Collection Time: 06/23/24 11:55 AM   Result Value Ref Range    WBC 14.4 (H) 3.8 - 10.6 x10e3/mcL    RBC 4.42 3.60 - 5.20 x10e6/mcL    Hemoglobin 14.5 11.5 - 15.7 g/dL    Hematocrit 55.1 65.9 - 47.0 %    MCV 101.4 (H) 81.0 - 99.0 fL    MCH 32.8 27.0 - 34.5 pg    MCHC 32.4 30.0 - 36.0 g/dL    RDW 86.4 89.9 - 82.9 %    Platelets 288 140 - 440 x10e3/mcL    MPV 10.0 7.0 - 12.2 fL    NRBC Automated 0.0 0.0 - 0.2 %    NRBC Absolute 0.00 0.00 - 0.01 x10e3/mcL    Neutrophils % 83.6 (H) 42.0 - 74.0 %    Lymphocytes 11.6 (L) 15.0 - 45.0 %    Monocytes % 3.9 (L) 4.0 - 12.0 %    Eosinophils % 0.1 0.0 - 7.0 %    Basophils % 0.3 0.0 - 2.0 %    Neutrophils Absolute 12.1 (H) 1.6 - 7.3 x10e3/mcL    Lymphocytes Absolute 1.7 1.0 - 3.2 x10e3/mcL    Monocytes Absolute 0.6 0.3 - 1.0 x10e3/mcL    Eosinophils Absolute 0.0 0.0 - 0.5 x10e3/mcL    Basophils Absolute 0.0 0.0 - 0.2 x10e3/mcL    Immature Granulocytes % 0.5 0.0 - 0.6 %    Immature Grans (Abs) 0.07 (H) 0.00 - 0.06 x10e3/mcL   Comprehensive Metabolic Panel    Collection Time: 06/23/24 11:55 AM   Result Value Ref Range    Sodium 147 (H) 135 - 145 mmol/L    Potassium Hemolyzed (A) 3.5 - 5.3 mmol/L    Chloride 111 (H) 98 - 107 mmol/L    CO2 19 (L)  22 - 29 mmol/L    Glucose 127 (H) 70 - 99 mg/dL    BUN 45 (H) 8 - 23 mg/dL    Creatinine 1.7 (H) 0.5 - 1.0 mg/dL    Anion Gap 17 2 - 17 mmol/L    Osmolaliy Calculated 305 (H) 270 - 287 mOsm/kg    Calcium 10.9 (H) 8.5 - 10.7 mg/dL    CALCIUM,CORRECTED,CCA 10.7 8.5 - 10.7 mg/dL    Total Protein 8.1 5.7 - 8.3 g/dL    Albumin 4.3 3.5 - 5.2 g/dL    Globulin 3.8 1.9 - 4.4 g/dL    Albumin/Globulin Ratio 1.13 1.00 - 2.70    Total Bilirubin 2.20 (H) 0.00 - 1.20 mg/dL    Alk Phosphatase 72 35 - 117 unit/L    AST 39 0 - 46 unit/L    ALT 22 0 - 42 unit/L    Est, Glom Filt Rate 30 (L) >=60 mL/min/1.51m   Magnesium    Collection Time: 06/23/24 11:55 AM   Result Value Ref Range    Magnesium 2.7 (H) 1.6 - 2.6 mg/dL   Potassium    Collection Time: 06/23/24 12:46 PM   Result Value Ref Range    Potassium 4.5 3.5 - 5.3 mmol/L   T4, Free    Collection Time: 06/23/24 12:46 PM  Result Value Ref Range    T4 Free 1.64 0.82 - 1.70 ng/dL   TSH    Collection Time: 06/23/24 12:46 PM   Result Value Ref Range    TSH, 3rd Generation 1.540 0.358 - 3.740 mcIU/mL   EKG 12 Lead (Abn HR)    Collection Time: 06/23/24  1:36 PM   Result Value Ref Range    Ventricular Rate 133 BPM    QRS Duration 86 ms    Q-T Interval 287 ms    QTc Calculation (Bazett) 428 ms    R Axis 31 degrees    T Axis 92 degrees    Diagnosis       ATRIAL FIBRILLATION WITH RAPID VENTRICULAR RESPONSE  LOW QRS VOLTAGE IN PRECORDIAL LEADS  [QRS DEFLECTION < 1.0 mV IN CHEST LEADS]  NONSPECIFIC ST   ABNORMAL ECG  Reviewed by _________________     EKG 12 Lead (Abn HR)    Collection Time: 06/23/24  2:37 PM   Result Value Ref Range    Ventricular Rate 79 BPM    P-R Interval 152 ms    QRS Duration 78 ms    Q-T Interval 422 ms    QTc Calculation (Bazett) 457 ms    P Axis 60 degrees    R Axis 58 degrees    T Axis 76 degrees    Diagnosis       SINUS RHYTHM WITH SINUS ARRHYTHMIA  LOW QRS VOLTAGE IN CHEST LEADS [QRS DEFLECTION < 1.0 MV IN CHEST LEADS]  NONSPECIFIC T WAVE ABNORMALITY  LONG QT  INTERVAL  ABNORMAL ECG          Imaging/Diagnostics Last 24 Hours   No results found.    Assessment      Hospital Problems           Last Modified POA    * (Principal) Atrial fibrillation with RVR (HCC) 06/23/2024 Yes       Plan   EGD with removal of food bolus and possible dilatation    Consultations Ordered:  IP CONSULT TO HOSPITALIST  IP CONSULT TO GI  IP CONSULT TO GI  IP CONSULT TO CARDIOLOGY    Electronically signed by Devona Pippin, MD on 06/23/24 at 3:15 PM EST      "

## 2024-06-23 NOTE — ED Provider Notes (Addendum)
 "[facility] EMERGENCY DEPT  EMERGENCY DEPARTMENT ENCOUNTER      Pt Name: Linda Murray  MRN: [id number]  Birthdate Oct 10, 1940  Date of evaluation: 06/23/2024  Provider: Lamar Victory Rohrer, MD    CHIEF COMPLAINT       Chief Complaint   Patient presents with    Abdominal Pain     Pt c/o abdominal pain and N/V that started yesterday. Pt spitting up water and not eating per son         HISTORY OF PRESENT ILLNESS    The history is provided by the patient and a relative.       83 year old female brought by son because of intractable vomiting and intolerance of secretions.  Patient has a previous history of esophageal stricture requiring dilatation.  No previous cardiac history.  No fevers, diarrhea or melena.    Nursing Notes were reviewed.    REVIEW OF SYSTEMS                                                                      Review of Systems    Except as noted above the remainder of the review of systems was reviewed and negative.       PAST MEDICAL HISTORY     Past Medical History:   Diagnosis Date    Brain aneurysm     (aphasia, hemiparesis right side).    CKD (chronic kidney disease), stage II     Hyperlipidemia     Osteoporosis     Seizure (HCC)     Urinary incontinence        SURGICAL HISTORY       Past Surgical History:   Procedure Laterality Date    BRAIN SURGERY         CURRENT MEDICATIONS       Previous Medications    CALCIUM CARB-CHOLECALCIFEROL (CALTRATE 600+D3) 600-20 MG-MCG TABS      1 tabs, Oral, BID, # 60 tabs, 0 Refill(s)    KETOCONAZOLE  (NIZORAL ) 2 % CREAM    Apply topically twice a day    MULTIPLE VITAMINS-MINERALS (CENTRUM/CERTA-VITE WITH MINERALS ORAL) SOLUTION    Take 15 mLs by mouth daily    OMEPRAZOLE  (PRILOSEC) 40 MG DELAYED RELEASE CAPSULE    TAKE 1 CAPSULE BY MOUTH EVERY MORNING FOR REFLUX       ALLERGIES     Patient has no known allergies.    FAMILY HISTORY     No family history on file.     SOCIAL HISTORY       Social History     Socioeconomic History    Marital status: Widowed     Spouse name: None     Number of children: None    Years of education: None    Highest education level: None   Tobacco Use    Smoking status: Former     Types: Cigarettes     Passive exposure: Never    Smokeless tobacco: Never   Substance and Sexual Activity    Alcohol use: Never    Drug use: Never     Social Drivers of Health     Physical Activity: Sufficiently Active (02/10/2024)    Exercise Vital Sign     Days of Exercise  per Week: 7 days     Minutes of Exercise per Session: 30 min       SCREENINGS       PHYSICAL EXAM       ED Triage Vitals   BP Girls Systolic BP Percentile Girls Diastolic BP Percentile Boys Systolic BP Percentile Boys Diastolic BP Percentile Temp Temp Source Pulse   06/23/24 1132 -- -- -- -- 06/23/24 1128 06/23/24 1128 06/23/24 1132   (!) 82/44     98.7 F (37.1 C) Axillary 89      Respirations SpO2 Height Weight - Scale       06/23/24 1128 06/23/24 1132 06/23/24 1128 06/23/24 1128       22 94 % 1.524 m (5') 43.1 kg (95 lb)           Gen:  Alert, mild agitation  VS: Hypotension noted in addition to supraventricular tachycardia  HEENT: No visible trauma, mucous membranes moist  Neck: No range of motion limitation.  No thyromegaly  Cardiovascular: Regular rate and rhythm  Lungs: No respiratory distress, O2 sat 100% on room air which is normal  Abdomen: Soft, nondistended, no pulsatile mass or tenderness  Musculoskeletal:  No visible deformity. No range of motion deficit.   Neurologic: Alert, no focal motor or sensory deficits.   Skin: Warm and dry, no visible injury.    DIAGNOSTIC RESULTS     EKG: All EKG's are interpreted by the Emergency Department Physician who either signs or Co-signs this chart in the absence of a cardiologist.    RADIOLOGY    Non-plain film images such as CT, Ultrasound and MRI are read by the radiologist. Plain radiographic images are visualized and preliminarily interpreted by the emergency physician with the below findings:    Interpretation per the Radiologist below, if available at the  time of this note:    No orders to display       LABS:  Labs Reviewed   CBC WITH AUTO DIFFERENTIAL - Abnormal; Notable for the following components:       Result Value    WBC 14.4 (*)     MCV 101.4 (*)     Neutrophils % 83.6 (*)     Lymphocytes 11.6 (*)     Monocytes % 3.9 (*)     Neutrophils Absolute 12.1 (*)     Immature Grans (Abs) 0.07 (*)     All other components within normal limits   COMPREHENSIVE METABOLIC PANEL - Abnormal; Notable for the following components:    Sodium 147 (*)     Potassium Hemolyzed (*)     Chloride 111 (*)     CO2 19 (*)     Glucose 127 (*)     BUN 45 (*)     Creatinine 1.7 (*)     Osmolaliy Calculated 305 (*)     Calcium 10.9 (*)     Total Bilirubin 2.20 (*)     Est, Glom Filt Rate 30 (*)     All other components within normal limits   MAGNESIUM - Abnormal; Notable for the following components:    Magnesium 2.7 (*)     All other components within normal limits   POTASSIUM       All other labs were within normal range or not returned as of this dictation.    EMERGENCY DEPARTMENT COURSE/REASSESSMENT and MDM:   Medical Decision Making  Amount and/or Complexity of Data Reviewed  Labs: ordered. Decision-making details documented in ED Course.  ECG/medicine tests: ordered and independent interpretation performed. Decision-making details documented in ED Course.  Discussion of management or test interpretation with external provider(s): Discussion with on-call gastroenterology, discussion with admitting physician    Risk  Prescription drug management.  Decision regarding hospitalization.  Emergency major surgery.  Risk Details: 83 year old female brought by son because of 2 days of intolerance of fluids and solids.  Previous esophageal stricture.  On arrival the patient is awake but hypotensive with evidence of SVT.  Managed aggressively to prevent decompensation of his hemodynamics.  IV fluids given in addition to a small amount of rate control agent.  Nausea improved with therapy but the  patient required multiple boluses and subsequent infusion of diltiazem  for adequate rate control.  Crystalloid also markedly helped her blood pressure.  Critical care time includes initial evaluation, aggressive management of blood pressure and heart rate, review and interpretation of diagnostic studies, multiple reevaluations and discussion with on-call gastroenterology and admitting hospitalist.  Critical care time does not include time for separately billable procedures.    Critical Care  Total time providing critical care: 90 minutes        ED Course as of 06/23/24 1435   Thu Jun 23, 2024   1144 Supraventricular tachycardia noted on the monitor.  She came in with a normal heart rate.  Patient is also hypotensive so this is managed aggressively with IV fluids and will provide some rate control.  I witnessed the patient having intolerance of liquids and some drooling so we will discuss this with gastroenterology [RE]   1157 Discussed with GI and we will plan on endoscopy with appropriate resuscitation. [RE]   1222 CBC noted.  Chemistry consistent with volume depletion as reflected by acute kidney injury along with what appears to be some hemoconcentration.  Heart rate remains out-of-control though blood pressure has improved with around 500 cc of crystalloid.  We will repeat diltiazem  and initiate infusion as well [RE]   1256 Second bag of crystalloid initiated.  Rate improving with added diltiazem .  Originally scheduled for endoscopy at 115 but I do not feel she is a good candidate at this time since she continues to exhibit atrial fibrillation with rapid response and likely require some resuscitation.  We will attempt a PCU admission here and feeling that we may have to upgrade her to the ICU strictly because her gastroenterologist would prefer this facility. [RE]   1319 Discussed with hospitalist and we will except the patient to the stepdown unit [RE]   1347 Twelve-lead EKG, interpreted by me  Indication:  Abnormal heart rate  Rate: 133  Rhythm: Atrial fibrillation with rapid ventricular response  Intervals: Within normal limits  QRS: Normal morphology  Axis: Normal   ST segment: Nonspecific ST change, no acute current of injury or ischemia  T waves: Normal   Interpretation: Abnormal EKG, new onset atrial fibrillation with rapid ventricular response and no injury [RE]   1434 Into the patient's second liter of crystalloid, she has converted to sinus rhythm with continued reassuring hemodynamics. [RE]      ED Course User Index  [RE] Michelina Lamar Shove, MD       PROCEDURE   Procedures        CONSULTS:  IP CONSULT TO HOSPITALIST  IP CONSULT TO GI    FINAL IMPRESSION      1. Hypotension due to hypovolemia    2. Atrial fibrillation, rapid (HCC)    3. Acute kidney injury    4. Esophageal  obstruction due to food impaction          DISPOSITION/PLAN   DISPOSITION Decision To Admit 06/23/2024 12:55:57 PM   DISPOSITION CONDITION Stable           PATIENT REFERRED TO:  No follow-up provider specified.    DISCHARGE MEDICATIONS:  New Prescriptions    No medications on file     Controlled Substances Monitoring:          No data to display                (Please note that portions of this note were completed with a voice recognition program.  Efforts were made to edit the dictations but occasionally words are mis-transcribed.)    Lamar Victory Rohrer, MD (electronically signed)  Attending Emergency Physician           Rohrer Lamar Victory, MD  06/23/24 1321       Rohrer Lamar Victory, MD  06/23/24 1403       Rohrer Lamar Victory, MD  06/23/24 1435    "

## 2024-06-23 NOTE — Anesthesia Pre Procedure (Signed)
 "Department of Anesthesiology  Preprocedure Note       Name:  Linda Murray   Age:  83 y.o.  DOB:  11/22/1940                                          MRN:  [id number]         Date:  06/23/2024      Surgeon: Clotilde):  Hadzijahic, Neven, MD    Procedure: Procedure(s):  ESOPHAGOGASTRODUODENOSCOPY    Medications prior to admission:   Prior to Admission medications   Medication Sig Start Date End Date Taking? Authorizing Provider   ketoconazole  (NIZORAL ) 2 % cream Apply topically twice a day 05/04/24   Elgin Redell MATSU, DPM   Multiple Vitamins-Minerals (CENTRUM/CERTA-VITE WITH MINERALS ORAL) solution Take 15 mLs by mouth daily    [provider]   omeprazole  (PRILOSEC) 40 MG delayed release capsule TAKE 1 CAPSULE BY MOUTH EVERY MORNING FOR REFLUX 11/06/21   [provider]   calcium carb-cholecalciferol (CALTRATE 600+D3) 600-20 MG-MCG TABS   1 tabs, Oral, BID, # 60 tabs, 0 Refill(s) 10/22/17   [provider]       Current medications:    Current Facility-Administered Medications   Medication Dose Route Frequency Provider Last Rate Last Admin    sodium chloride  flush 0.9 % injection 5-40 mL  5-40 mL IntraVENous 2 times per day Debby Vernell Folks, MD        sodium chloride  flush 0.9 % injection 5-40 mL  5-40 mL IntraVENous PRN Debby Vernell Folks, MD        0.9 % sodium chloride  infusion   IntraVENous PRN Debby Vernell Folks, MD        ondansetron  (ZOFRAN -ODT) disintegrating tablet 4 mg  4 mg Oral Q8H PRN Debby Vernell Folks, MD        Or    ondansetron  (ZOFRAN ) injection 4 mg  4 mg IntraVENous Q6H PRN Debby Vernell Folks, MD        polyethylene glycol (GLYCOLAX ) packet 17 g  17 g Oral Daily PRN Debby Vernell Folks, MD        aluminum & magnesium hydroxide-simethicone (MAALOX PLUS) 200-200-20 MG/5ML suspension 30 mL  30 mL Oral Q6H PRN Debby Vernell Folks, MD        acetaminophen  (TYLENOL ) tablet 650 mg  650 mg Oral Q6H PRN Debby Vernell Folks, MD        Or     acetaminophen  (TYLENOL ) suppository 650 mg  650 mg Rectal Q6H PRN Debby Vernell Folks, MD        0.9 % sodium chloride  infusion   IntraVENous Continuous Debby Vernell Folks, MD        pantoprazole  (PROTONIX ) injection 40 mg  40 mg IntraVENous Daily Debby Vernell Folks, MD         Current Outpatient Medications   Medication Sig Dispense Refill    ketoconazole  (NIZORAL ) 2 % cream Apply topically twice a day 60 g 1    Multiple Vitamins-Minerals (CENTRUM/CERTA-VITE WITH MINERALS ORAL) solution Take 15 mLs by mouth daily      omeprazole  (PRILOSEC) 40 MG delayed release capsule TAKE 1 CAPSULE BY MOUTH EVERY MORNING FOR REFLUX      calcium carb-cholecalciferol (CALTRATE 600+D3) 600-20 MG-MCG TABS   1 tabs, Oral, BID, # 60 tabs, 0 Refill(s)         Allergies:  No Known  Allergies    Problem List:    Patient Active Problem List   Diagnosis Code    Atrial fibrillation with RVR (HCC) I48.91       Past Medical History:        Diagnosis Date    Brain aneurysm     (aphasia, hemiparesis right side).    CKD (chronic kidney disease), stage II     Hyperlipidemia     Osteoporosis     Seizure (HCC)     Urinary incontinence        Past Surgical History:        Procedure Laterality Date    BRAIN SURGERY         Social History:    Social History     Tobacco Use    Smoking status: Former     Types: Cigarettes     Passive exposure: Never    Smokeless tobacco: Never   Substance Use Topics    Alcohol use: Never                                Counseling given: Not Answered      Vital Signs (Current):   Vitals:    06/23/24 1357 06/23/24 1412 06/23/24 1430 06/23/24 1437   BP: 121/71  112/71    Pulse: 78 86 86 75   Resp: 30 22 14 19    Temp:       TempSrc:       SpO2: 95% 95% 97% 96%   Weight:       Height:                                                  BP Readings from Last 3 Encounters:   06/23/24 112/71   02/11/24 122/74   02/11/23 117/67       NPO Status:                                                                                  BMI:   Wt Readings from Last 3 Encounters:   06/23/24 43.1 kg (95 lb)   06/06/24 43.1 kg (95 lb)   11/23/23 45.8 kg (101 lb)     Body mass index is 18.55 kg/m.    CBC:   Lab Results   Component Value Date/Time    WBC 14.4 06/23/2024 11:55 AM    RBC 4.42 06/23/2024 11:55 AM    HGB 14.5 06/23/2024 11:55 AM    HCT 44.8 06/23/2024 11:55 AM    MCV 101.4 06/23/2024 11:55 AM    RDW 13.5 06/23/2024 11:55 AM    PLT 288 06/23/2024 11:55 AM       CMP:   Lab Results   Component Value Date/Time    NA 147 06/23/2024 11:55 AM    K 4.5 06/23/2024 12:46 PM    CL 111 06/23/2024 11:55 AM    CO2 19 06/23/2024 11:55 AM    BUN 45 06/23/2024 11:55 AM  CREATININE 1.7 06/23/2024 11:55 AM    GFRAA 96 02/03/2020 11:02 AM    AGRATIO 1.1 02/03/2020 11:02 AM    LABGLOM 30 06/23/2024 11:55 AM    LABGLOM 50 08/11/2022 12:32 PM    GLUCOSE 127 06/23/2024 11:55 AM    CALCIUM 10.9 06/23/2024 11:55 AM    BILITOT 2.20 06/23/2024 11:55 AM    ALKPHOS 72 06/23/2024 11:55 AM    AST 39 06/23/2024 11:55 AM    ALT 22 06/23/2024 11:55 AM       POC Tests: No results for input(s): POCGLU, POCNA, POCK, POCCL, POCBUN, POCHEMO, POCHCT in the last 72 hours.    Coags: No results found for: PROTIME, INR, APTT    HCG (If Applicable): No results found for: PREGTESTUR, PREGSERUM, HCG, HCGQUANT     ABGs: No results found for: PHART, PO2ART, PCO2ART, HCO3ART, BEART, O2SATART     Type & Screen (If Applicable):  No results found for: ABORH, LABANTI    Drug/Infectious Status (If Applicable):  No results found for: HIV, HEPCAB    COVID-19 Screening (If Applicable):   Lab Results   Component Value Date/Time    COVID19 Reactive 08/23/2019 04:34 AM    COVID19 Detected 08/09/2019 11:11 AM           Anesthesia Evaluation    Patient summary reviewed     no history of anesthetic complications:  Airway:  Mallampati: III            Dental:           Pulmonary:   normal exam              Cardiovascular:                   ROS comment: Presented to the ED and was found to have an heart rate in the 220s with hypotension.  She was treated with diltiazem  and converted with heart rate currently in the 80s and blood pressure within normal limits remains on Dilt drip.       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        GI/Hepatic/Renal:           ROS comment: Patient presents for EGD for dysphagia. had a similar episode in 2017 requiring EGD at that time.   Endo/Other:                 ROS comment: Patient resides in independent living facility       Abdominal:              Vascular:         Other Findings:  Frail, elderly appearing white female in no acute distress          Anesthesia Plan      general     ASA 3       Induction: intravenous.      Anesthetic plan and risks discussed with patient and child/children.                      Merilee Fairy Frankel, MD   06/23/2024            "

## 2024-06-23 NOTE — H&P (Signed)
 "Hospitalist History and Physical      CHIEF COMPLAINT: Nausea vomiting versus spitting up    PCP: Dr. 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  The patient's son says symptoms started yesterday as far as he is aware.  He went to see her at her independent livingAnd she had a lot of tissues around where she appeared to be spitting things up.  He noticed she was having difficulty swallowing.  The patient denies any feeling of nausea to me and actually says she does not feel like anything is stuck in her throat or chest but says she does feel like things will not go all the way down.  Because of this she was brought to the ER.  There were plans for her to go for EGD to assess for food impaction or esophageal stricture but she was noted to be in A-fib with elevated heart rates and some hypotension.  She was seenFluids which improved her blood pressure and heart rate Psalm and then diltiazem  push and was subsequently placed on a diltiazem  drip.  By the time I am evaluating her she is on diltiazem  drip at 5 mg/h and has converted to normal sinus rhythm with a rate in the 80s.    She denies any palpitations to me but does state she felt short of breath when her heart rate was elevated.  She has been feeling generally weak since she has not been able to eat or drink very much over the last couple of days.  She denies any abdominal pain or diarrhea.  Her son Myer is at the bedside and helps provide some of the history.    Past Medical History:    1.  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  Esophageal stricture  3.  Osteoporosis    Past Surgical History:    Craniotomy with hematoma evacuation after brain aneurysm rupture  Esophageal dilation    Medications Prior to  Admission:    Prolia  injections  Prilosec 40 mg daily  Multivitamin daily  Calcium vitamin D daily    Allergies:    Patient has no known allergies.    Social History:   Lives at [facility] independent living and uses a wheelchair to get around and cane and transfers independently  No tobacco or alcohol use       Family History:   Father had heart problems  Mother suffer from alcohol abuse    REVIEW OF SYSTEMS:  General: No fever, chills, headache.  She feels generally weak  Ophthalmic: No vision changes, eye pain  ENT: Mouth is been dry and she has been spitting up  Respiratory: No SOB, cough  Cardiovascular: No chest pain, palpitations, PND, orthopnea  Gastrointestinal: Denies nausea.  Unclear if she is vomiting versus spitting up.  No diarrhea no abdominal pain  Genitourinary: No dysuria or hematuria  Musculoskeletal: No arthralgia, back pain  Neurological: She has some right sided weakness which is chronic  Dermatological: No skin rash, ulcerations, hives     PHYSICAL EXAM:  Vitals:  BP 112/71   Pulse 75   Temp 98.1 F (36.7 C)   Resp 19   Ht 1.524 m (5')   Wt 43.1 kg (95 lb)  SpO2 96%   BMI 18.55 kg/m   Body mass index is 18.55 kg/m.     Gen: eldery, NAD  ENT: moist mucous membranes  CV: RRR no murmur, no LE edema  Lungs: CTAB no crackles  Abd: soft, nt, nd, +bS  Ext: R leg in brace  Neuro: R side weaker than left    LABS:  CBC:  Recent Labs     06/23/24  1155   WBC 14.4*   RBC 4.42   HGB 14.5   HCT 44.8   MCV 101.4*   RDW 13.5   PLT 288     CHEMISTRIES:  Recent Labs     06/23/24  1155 06/23/24  1246   NA 147*  --    K Hemolyzed* 4.5   CL 111*  --    CO2 19*  --    BUN 45*  --    CREATININE 1.7*  --    GLUCOSE 127*  --    MG 2.7*  --    CALCIUM 10.9*  --      PT/INR:No results for input(s): PROTIME, INR in the last 72 hours.  LIVER PROFILE:  Recent Labs     06/23/24  1155   AST 39   ALT 22   BILITOT 2.20*   ALKPHOS 72         ASSESSMENT AND PLAN:    1.  Paroxysmal atrial fibrillation with RVR-she  has converted to normal sinus rhythm after diltiazem  drip in the ER.  I will stop that for now.  Blood pressure has normalized as well.  Will check TSH.  Will order an echo.  I will ask cardiology to see her to determine if she needs to be on any rate controlling medications for a longer term.  I am going to hold off for now as I think this may have all been exacerbated by her GI issue.  CHA2DS2-VASc score would be 3 for sex and age.  She has had hemorrhagic stroke in the past related to aneurysm so holding anticoagulation for now.    2.  Nausea/vomiting/spitting up-discussed with Dr. Laqueta and she will go for EGD this afternoon to assess for food impaction versus esophageal stricture that needs dilation.  She will be kept n.p.o. for now and we will place her on IV Protonix .    3.AKI-creatinine is 1.7 up from baseline of 1 due to volume depletion in the setting of poor oral intake.  Will continue hydration with fluids and repeat BMP in the morning.    4.  Leukocytosis-afebrile and no obvious infectiousWorse.  Will check a urinalysis for completeness.  This may be due to some vomiting and reactive in nature.  Plan to repeat a CBC in the morning.    5.  Elevated bilirubin-this is mild but is an increase from previous labs in our system.  I will just repeat LFTs tomorrow.  Other transaminases and alkaline phosphatase are normal.    6.  Prophylaxis-Protonix  IV, SCDs.    Discussed with Dr. Michelina in the ER.    Discussed with son Rob at the bedside.    Admitted as observation.        "

## 2024-06-23 NOTE — Anesthesia Pre Procedure (Signed)
 "Department of Anesthesiology  Preprocedure Note       Name:  Linda Murray   Age:  83 y.o.  DOB:  [birth year]                                          MRN:  [id number]         Date:  06/23/2024      Surgeon: Clotilde):  Hadzijahic, Neven, MD    Procedure: Procedure(s):  ESOPHAGOGASTRODUODENOSCOPY    Medications prior to admission:   Prior to Admission medications   Medication Sig Start Date End Date Taking? Authorizing Provider   ketoconazole  (NIZORAL ) 2 % cream Apply topically twice a day 05/04/24   Elgin Redell MATSU, DPM   Multiple Vitamins-Minerals (CENTRUM/CERTA-VITE WITH MINERALS ORAL) solution Take 15 mLs by mouth daily    [provider]   omeprazole  (PRILOSEC) 40 MG delayed release capsule TAKE 1 CAPSULE BY MOUTH EVERY MORNING FOR REFLUX 11/06/21   [provider]   calcium carb-cholecalciferol (CALTRATE 600+D3) 600-20 MG-MCG TABS   1 tabs, Oral, BID, # 60 tabs, 0 Refill(s) 10/22/17   [provider]       Current medications:    Current Facility-Administered Medications   Medication Dose Route Frequency Provider Last Rate Last Admin    sodium chloride  0.9 % bolus 1,000 mL  1,000 mL IntraVENous Once Escarza, Robert Henry, MD 495.9 mL/hr at 06/23/24 1156 1,000 mL at 06/23/24 1156    dilTIAZem  injection 20 mg  20 mg IntraVENous Once Escarza, Robert Henry, MD        Followed by    dilTIAZem  100 mg in sodium chloride  0.9 % 100 mL infusion ([facility])  2.5-15 mg/hr IntraVENous Continuous Michelina Lamar Shove, MD         Current Outpatient Medications   Medication Sig Dispense Refill    ketoconazole  (NIZORAL ) 2 % cream Apply topically twice a day 60 g 1    Multiple Vitamins-Minerals (CENTRUM/CERTA-VITE WITH MINERALS ORAL) solution Take 15 mLs by mouth daily      omeprazole  (PRILOSEC) 40 MG delayed release capsule TAKE 1 CAPSULE BY MOUTH EVERY MORNING FOR REFLUX      calcium carb-cholecalciferol (CALTRATE 600+D3) 600-20 MG-MCG TABS   1 tabs, Oral, BID, # 60 tabs, 0 Refill(s)          Allergies:  No Known Allergies    Problem List:  There is no problem list on file for this patient.      Past Medical History:        Diagnosis Date    Brain aneurysm     (aphasia, hemiparesis right side).    CKD (chronic kidney disease), stage II     Hyperlipidemia     Osteoporosis     Seizure (HCC)     Urinary incontinence        Past Surgical History:        Procedure Laterality Date    BRAIN SURGERY         Social History:    Social History     Tobacco Use    Smoking status: Former     Types: Cigarettes     Passive exposure: Never    Smokeless tobacco: Never   Substance Use Topics    Alcohol use: Never  Counseling given: Not Answered      Vital Signs (Current):   Vitals:    06/23/24 1200 06/23/24 1206 06/23/24 1220 06/23/24 1226   BP: (!) 81/66 90/77 103/78    Pulse: (!) 146 (!) 156 (!) 141 (!) 154   Resp:   19 24   Temp:       TempSrc:       SpO2: 95%  95% 95%   Weight:       Height:                                                  BP Readings from Last 3 Encounters:   06/23/24 103/78   02/11/24 122/74   02/11/23 117/67       NPO Status:                                                                                 BMI:   Wt Readings from Last 3 Encounters:   06/23/24 43.1 kg (95 lb)   06/06/24 43.1 kg (95 lb)   11/23/23 45.8 kg (101 lb)     Body mass index is 18.55 kg/m.    CBC:   Lab Results   Component Value Date/Time    WBC 14.4 06/23/2024 11:55 AM    RBC 4.42 06/23/2024 11:55 AM    HGB 14.5 06/23/2024 11:55 AM    HCT 44.8 06/23/2024 11:55 AM    MCV 101.4 06/23/2024 11:55 AM    RDW 13.5 06/23/2024 11:55 AM    PLT 288 06/23/2024 11:55 AM       CMP:   Lab Results   Component Value Date/Time    NA 147 06/23/2024 11:55 AM    K Hemolyzed 06/23/2024 11:55 AM    CL 111 06/23/2024 11:55 AM    CO2 19 06/23/2024 11:55 AM    BUN 45 06/23/2024 11:55 AM    CREATININE 1.7 06/23/2024 11:55 AM    GFRAA 96 02/03/2020 11:02 AM    AGRATIO 1.1 02/03/2020 11:02 AM    LABGLOM 30  06/23/2024 11:55 AM    LABGLOM 50 08/11/2022 12:32 PM    GLUCOSE 127 06/23/2024 11:55 AM    CALCIUM 10.9 06/23/2024 11:55 AM    BILITOT 2.20 06/23/2024 11:55 AM    ALKPHOS 72 06/23/2024 11:55 AM    AST 39 06/23/2024 11:55 AM    ALT 22 06/23/2024 11:55 AM       POC Tests: No results for input(s): POCGLU, POCNA, POCK, POCCL, POCBUN, POCHEMO, POCHCT in the last 72 hours.    Coags: No results found for: PROTIME, INR, APTT    HCG (If Applicable): No results found for: PREGTESTUR, PREGSERUM, HCG, HCGQUANT     ABGs: No results found for: PHART, PO2ART, PCO2ART, HCO3ART, BEART, O2SATART     Type & Screen (If Applicable):  No results found for: ABORH, LABANTI    Drug/Infectious Status (If Applicable):  No results found for: HIV, HEPCAB    COVID-19 Screening (If Applicable):   Lab Results   Component  Value Date/Time    COVID19 Reactive 08/23/2019 04:34 AM    COVID19 Detected 08/09/2019 11:11 AM           Anesthesia Evaluation    Patient summary reviewed and Nursing notes reviewed     no history of anesthetic complications:  Airway:  Mallampati: II  TM distance: >3 FB   Neck ROM: full    Mouth opening: > = 3 FB   Dental:  normal exam         Pulmonary:   Negative Pulmonary ROS and normal exam              Cardiovascular:     Exercise tolerance: good (>4 METS)    (+)               dysrhythmias: atrial fibrillation                                 Beta Blocker:  Not on Beta Blocker    ROS comment: A FIB with RVR  10mg  diltiazem      Neuro/Psych:    (+)   neuromuscular disease:    CVA:               ROS comment: hemiparesis        GI/Hepatic/Renal:    (+)         renal disease: CRI         ROS comment: Food bolus/impaction    Cr 1.7.   Endo/Other:     (+)   :  :  arthritis:  electrolyte abnormalities                   Abdominal:  normal exam            Vascular:  negative vascular ROS.       Other Findings:            Anesthesia Plan      general     ASA 4 - emergent       Induction:  intravenous.    MIPS: Postoperative opioids intended, Prophylactic antiemetics administered and Postoperative trial extubation.  Anesthetic plan and risks discussed with patient (RSI with ETT).      Plan discussed with CRNA.    Attending anesthesiologist reviewed and agrees with Preprocedure content              Alm JINNY Livers, MD   06/23/2024            "

## 2024-06-24 ENCOUNTER — Encounter: Primary: Family Medicine

## 2024-06-24 ENCOUNTER — Observation Stay: Admit: 2024-06-24 | Payer: Medicare (Managed Care) | Primary: Family Medicine

## 2024-06-24 ENCOUNTER — Observation Stay: Payer: Medicare (Managed Care) | Primary: Family Medicine

## 2024-06-24 DIAGNOSIS — E861 Hypovolemia: Principal | ICD-10-CM

## 2024-06-24 LAB — ECHO (TTE) COMPLETE (PRN CONTRAST/BUBBLE/STRAIN/3D)
AR Max Velocity PISA: 3.7 m/s
AR PHT: 593 ms
Ao Root Index: 2.5 cm/m2
Aortic Root: 3.4 cm
Body Surface Area: 1.35 m2
E/E' Lateral: 6.24
E/E' Ratio (Averaged): 7.71
E/E' Septal: 9.19
EF Physician: 57 %
Est. RA Pressure: 15 mmHg
IVC Expiration: 2.2 cm
IVSd: 0.9 cm (ref 0.6–0.9)
LA Diameter: 2.7 cm
LA Size Index: 1.99 cm/m2
LA/AO Root Ratio: 0.79
LV E' Lateral Velocity: 8.49 cm/s
LV E' Septal Velocity: 5.77 cm/s
LV EDV A4C: 68 mL
LV EDV Index A4C: 50 mL/m2
LV ESV A4C: 29 mL
LV ESV Index A4C: 21 mL/m2
LV Ejection Fraction A4C: 57 %
LV Mass 2D Index: 80.7 g/m2 (ref 43–95)
LV Mass 2D: 109.7 g (ref 67–162)
LV RWT Ratio: 0.45
LVIDd Index: 2.94 cm/m2
LVIDd: 4 cm (ref 3.9–5.3)
LVOT Area: 3.1 cm2
LVOT Diameter: 2 cm
LVOT Mean Gradient: 1 mmHg
LVOT Peak Gradient: 2 mmHg
LVOT Peak Velocity: 0.6 m/s
LVOT SV: 43 mL
LVOT Stroke Volume Index: 31.4 mL/m2
LVOT VTI: 13.6 cm
LVPWd: 0.9 cm (ref 0.6–0.9)
MV A Velocity: 0.63 m/s
MV E Velocity: 0.53 m/s
MV E Wave Deceleration Time: 278 ms
MV E/A: 0.84
RV Free Wall Peak S': 11 cm/s
RVIDd: 2.4 cm
RVSP: 36 mmHg
TAPSE: 1.8 cm (ref 1.7–?)
TR Max Velocity: 2.29 m/s
TR Peak Gradient: 21 mmHg

## 2024-06-24 LAB — CBC
Hematocrit: 38.2 % (ref 34.0–47.0)
Hemoglobin: 12.3 g/dL (ref 11.5–15.7)
MCH: 33 pg (ref 27.0–34.5)
MCHC: 32.2 g/dL (ref 30.0–36.0)
MCV: 102.4 fL — ABNORMAL HIGH (ref 81.0–99.0)
MPV: 10 fL (ref 7.0–12.2)
NRBC Absolute: 0 x10e3/mcL (ref 0.00–0.01)
NRBC Automated: 0 % (ref 0.0–0.2)
Platelets: 223 x10e3/mcL (ref 140–440)
RBC: 3.73 x10e6/mcL (ref 3.60–5.20)
RDW: 13.6 % (ref 10.0–17.0)
WBC: 7.3 x10e3/mcL (ref 3.8–10.6)

## 2024-06-24 LAB — COMPREHENSIVE METABOLIC PANEL
ALT: 15 U/L (ref 0–42)
AST: 22 U/L (ref 0–46)
Albumin/Globulin Ratio: 1.23 (ref 1.00–2.70)
Albumin: 3.7 g/dL (ref 3.5–5.2)
Alk Phosphatase: 62 U/L (ref 35–117)
Anion Gap: 12 mmol/L (ref 2–17)
BUN: 32 mg/dL — ABNORMAL HIGH (ref 8–23)
CALCIUM,CORRECTED,CCA: 9.3 mg/dL (ref 8.5–10.7)
CO2: 17 mmol/L — ABNORMAL LOW (ref 22–29)
Calcium: 9.1 mg/dL (ref 8.5–10.7)
Chloride: 118 mmol/L — ABNORMAL HIGH (ref 98–107)
Creatinine: 1.2 mg/dL — ABNORMAL HIGH (ref 0.5–1.0)
Est, Glom Filt Rate: 45 mL/min/1.73mÂ² — ABNORMAL LOW (ref >=60)
Globulin: 3 g/dL (ref 1.9–4.4)
Glucose: 108 mg/dL — ABNORMAL HIGH (ref 70–99)
Osmolaliy Calculated: 299 mosm/kg — ABNORMAL HIGH (ref 270–287)
Potassium: 4.3 mmol/L (ref 3.5–5.3)
Sodium: 147 mmol/L — ABNORMAL HIGH (ref 135–145)
Total Bilirubin: 1.26 mg/dL — ABNORMAL HIGH (ref 0.00–1.20)
Total Protein: 6.7 g/dL (ref 5.7–8.3)

## 2024-06-24 LAB — CARDIAC EVENT/MCOT MONITOR: Body Surface Area: 1.35 m2

## 2024-06-24 MED ORDER — SUCRALFATE 1 G PO TABS
1 | Freq: Four times a day (QID) | ORAL | Status: DC
Start: 2024-06-24 — End: 2024-06-24
  Administered 2024-06-24: 16:00:00 1 g via ORAL

## 2024-06-24 MED ORDER — SUCRALFATE 1 G PO TABS
1 | ORAL_TABLET | Freq: Four times a day (QID) | ORAL | 0 refills | 30.00000 days | Status: AC
Start: 2024-06-24 — End: ?

## 2024-06-24 MED ORDER — PANTOPRAZOLE SODIUM 40 MG PO TBEC
40 | Freq: Two times a day (BID) | ORAL | Status: DC
Start: 2024-06-24 — End: 2024-06-24

## 2024-06-24 MED ORDER — METOPROLOL SUCCINATE ER 25 MG PO TB24
25 | Freq: Every day | ORAL | Status: DC
Start: 2024-06-24 — End: 2024-06-24
  Administered 2024-06-24: 16:00:00 25 mg via ORAL

## 2024-06-24 MED ORDER — METOPROLOL SUCCINATE ER 25 MG PO TB24
25 | ORAL_TABLET | Freq: Every day | ORAL | 3 refills | 90.00000 days | Status: AC
Start: 2024-06-24 — End: ?

## 2024-06-24 MED ORDER — OMEPRAZOLE 40 MG PO CPDR
40 | ORAL_CAPSULE | Freq: Two times a day (BID) | ORAL | 0 refills | 90.00000 days | Status: AC
Start: 2024-06-24 — End: ?

## 2024-06-24 MED FILL — PANTOPRAZOLE SODIUM 40 MG IV SOLR: 40 mg | INTRAVENOUS | Qty: 40 | Fill #0

## 2024-06-24 MED FILL — METOPROLOL SUCCINATE ER 25 MG PO TB24: 25 mg | ORAL | Qty: 1 | Fill #0

## 2024-06-24 MED FILL — SUCRALFATE 1 G PO TABS: 1 g | ORAL | Qty: 1 | Fill #0

## 2024-06-24 NOTE — Consults (Signed)
 "    Consultation Note               Date:  June 24, 2024  Patient name: Linda Murray  Date of Birth: Feb 14, 1941    Reason for Consult: New onset paroxysmal atrial fibrillation  Requesting Physician: Dr. Debby    HISTORY OF PRESENT ILLNESS     83 year old female with past medical history of hemorrhagic stroke from a brain aneurysm with right-sided weakness, esophageal stricture status post dilation, osteoporosis who presented to ER yesterday due to vomiting, intolerance of secretions and dysphagia.  He is not able to tolerate liquids and there is concern for food impaction or esophageal stricture.  GI was contacted with plans for EGD.  While she was in the emergency room, she admitted to atrial fibrillation with RVR.  She was given IV fluid and then a diltiazem  bolus and was started on a diltiazem  drip and subsequently converted to normal sinus rhythm.  Her diltiazem  drip was stopped and she remains in normal sinus rhythm.  Cardiology was consulted for new onset paroxysmal atrial fibrillation.  She subsequently underwent EGD yesterday which revealed food impaction in the esophagus which was dislodged and post impaction ulcer in the midesophagus with known obstructive Schatzki's ring and large hiatal hernia with intrathoracic stomach.  She also had AKI with a creatinine of 1.7 which is improved to 1.2 today.    PAST HISTORY      Past Medical History:   has a past medical history of Brain aneurysm, CKD (chronic kidney disease), stage II, Hyperlipidemia, Osteoporosis, Seizure (HCC), and Urinary incontinence.    Past Surgical History:   has a past surgical history that includes brain surgery.     Social History:   reports that she has quit smoking. Her smoking use included cigarettes. She has never been exposed to tobacco smoke. She has never used smokeless tobacco. She reports that she does not drink alcohol and does not use drugs.     Family History: family history is not on file.      HOME MEDICATIONS     Prior to  Admission medications   Medication Sig Start Date End Date Taking? Authorizing Provider   ketoconazole  (NIZORAL ) 2 % cream Apply topically twice a day 05/04/24   Elgin Redell MATSU, DPM   Multiple Vitamins-Minerals (CENTRUM/CERTA-VITE WITH MINERALS ORAL) solution Take 15 mLs by mouth daily    [provider]   omeprazole  (PRILOSEC) 40 MG delayed release capsule  11/06/21   [provider]   calcium carb-cholecalciferol (CALTRATE 600+D3) 600-20 MG-MCG TABS  10/22/17   [provider]       Allergies:  Patient has no known allergies.    REVIEW OF SYSTEMS:    General: Negative for fevers or chills  Respiratory: Negative for cough, shortness of breath, or wheezing  Cardiovascular: Negative for chest pain, dyspnea on exertion, edema, irregular heartbeat, loss of consciousness, palpitations, or paroxysmal nocturnal dyspnea  Gastrointestinal: Negative for abdominal pain, appetite loss, blood in stools, constipation, diarrhea, heartburn, or nausea/vomiting  Genitourinary: Negative for dysuria or hematuria  Musculoskeletal: Negative for joint pain, joint swelling, muscle pain, or muscular weakness  Neurological:  Negative for gait disturbance, numbness/tingling, visual changes, or weakness       PHYSICAL EXAMINATION      BP 127/68   Pulse 68   Temp 99 F (37.2 C) (Oral)   Resp 16   Ht 1.524 m (5')   Wt 43.1 kg (95 lb)   SpO2  94%   BMI 18.55 kg/m      Physical Exam:  General: Alert, oriented, no acute distress  HEENT: Normocephalic, moist mucous membranes  Neck: no JVD, no stiffness   Cardiac: Regular rate and rhythm with no audible murmurs  Respiratory: Clear to auscultation bilaterally, normal respiratory effort   Abdomen: Soft nontender nondistended, normal bowel sounds  Extremities: Without edema, no obvious rashes  Neuro: A&O x3  Musculoskeletal: Normal ROM   Psych: appropriate affect    LABS AND DIAGNOSTICS        CBC:   Recent Labs     06/23/24  1155 06/24/24  0620   WBC 14.4* 7.3   HGB  14.5 12.3   PLT 288 223     BMP:    Recent Labs     06/23/24  1155 06/23/24  1246 06/24/24  0620   NA 147*  --  147*   K Hemolyzed* 4.5 4.3   CL 111*  --  118*   CO2 19*  --  17*   BUN 45*  --  32*   CREATININE 1.7*  --  1.2*   GLUCOSE 127*  --  108*   CALCIUM 10.9*  --  9.1     Hepatic:   Recent Labs     06/23/24  1155 06/24/24  0620   AST 39 22   ALT 22 15   BILITOT 2.20* 1.26*   ALKPHOS 72 62     CHA2DS2-VASc Score for Atrial Fibrillation Stroke Risk   Risk   Factors  Component Value   C CHF No 0   H HTN No 0   A2 Age >= 75 Yes,  (83 y.o.) 2   D DM No 0   S2 Prior Stroke/TIA No 0   V Vascular Disease No 0   A Age 25-74 No,  (83 y.o.) 0   Sc Sex female 1    CHA2DS2-VASc  Score  3   Score last updated 06/24/24 8:18 AM EST    Click here for a link to the UpToDate guideline Atrial Fibrillation: Anticoagulation therapy to prevent embolization    Disclaimer: Risk Score calculation is dependent on accuracy of patient problem list and past encounter diagnosis.       ASSESSMENT / RECOMMENDATIONS      1.  New onset paroxysmal atrial fibrillation with RVR  She converted to sinus rhythm after being started on diltiazem  drip.  She has remained in normal sinus rhythm.  Echocardiogram pending.  TSH normal.  Recommend continue to monitor telemetry while inpatient.  This episode was likely triggered by her GI symptoms with vomiting.  Recommend starting metoprolol  succinate 25 mg XL daily.  Will avoid anticoagulation at this time given history of hemorrhagic CVA as well as recent GI food impaction with ulceration.  Recommend 30-day cardiac monitor at discharge to evaluate for recurrent atrial fibrillation.    2.  Food impaction, post impaction ulceration  3.  History of hemorrhagic CVA from brain aneurysm    If echocardiogram is unrevealing and patient remains in normal sinus rhythm, she would be stable for discharge today from a cardiac perspective.  Will place order for 30-day MCOT monitor to be placed today.  Will have our  office call her to schedule follow-up in 6 to 8 weeks after her cardiac monitor is completed.     Electronically signed by: Damien DELENA Pinal, PA-C     NOTE: This report was transcribed using voice recognition software. Every effort  was made to ensure accuracy, however, inadvertent computerized transcription errors may be present.     "

## 2024-06-24 NOTE — Discharge Summary (Signed)
 "Physician Discharge Summary     Patient ID:  Linda Murray [id number] 83 y.o. 07-27-1940    Admit date: 06/23/2024  Discharge date and time: 06/24/2024  Admitting Physician: Vernell Benton Ned, MD   Discharge Physician: Vernell Benton Ned, MD       Discharge Diagnoses   Principal Problem:    Esophageal obstruction due to food impaction  Active Problems:    Esophageal ulcer    Atrial fibrillation with RVR (HCC)    Acute kidney injury    Hiatal hernia  Resolved Problems:    * No resolved hospital problems. 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  The patient's son says symptoms started yesterday as far as he is aware.  He went to see her [facility] independent livingAnd she had a lot of tissues around where she appeared to be spitting things up.  He noticed she was having difficulty swallowing.  The patient denies any feeling of nausea to me and actually says she does not feel like anything is stuck in her throat or chest but says she does feel like things will not go all the way down.  Because of this she was brought to the ER.  There were plans for her to go for EGD to assess for food impaction or esophageal stricture but she was noted to be in A-fib with elevated heart rates and some hypotension.  She was seenFluids which improved her blood pressure and heart rate Psalm and then diltiazem  push and was subsequently placed on a diltiazem  drip.  By the time I am evaluating her she is on diltiazem  drip at 5 mg/h and has converted to normal sinus rhythm with a rate in the 80s.     She denies any palpitations to me but does state she felt short of breath when her heart rate was elevated.  She has been feeling generally weak since she has not been able to eat or drink very much over the last couple of days.  She denies any abdominal  pain or diarrhea.  Her son Myer is at the bedside and helps provide some of the history.    Hospital Course:   1. Food impaction with esophageal ulcer-cause of spitting up prior to admission. Went for EGD and impaction was relieved. She has an ulcer and GI recommends increasing PPI to bid for 4 weeks, then daily thereafter. She should continue full liquid diet for a couple days and then can resume regular. A prescription for carafate  has been sent her pharmacy for 1 month. She will follow up with GI as outpatient.    2. Paroxysmal atrial fibrillation-she was hypotensive and tachycardic in the ER which improved with fluid and dilt gtt. She convertd to NSR in the ER and has been stable off dilt gtt overnight. Cardiology has seen her and recommends she discharge on low dose metoprolol  xl 25 mg daily and wear a monitor to determine if afib is recurrent. They will arrange follow up after discharge. Echo was not done as inpatient but this can be arranged to be done on an outpatient basis.    3. Hiatal hernia with intrathoracic stomach-continue PPI.     4. AKI-this was due to poor po intake in the setting of #1 and crt is close to baseline after  IV fluids on the day of discharge.    She will return to her independent living facility [facility] at discharge.     Consults:  IP CONSULT TO HOSPITALIST  IP CONSULT TO GI  IP CONSULT TO GI  IP CONSULT TO CARDIOLOGY    Surgeries/Procedures Performed:  Procedure(s):  ESOPHAGOGASTRODUODENOSCOPY FOREIGN BODY REMOVAL       Recent Labs: CBC:   Lab Results   Component Value Date/Time    WBC 7.3 06/24/2024 06:20 AM    RBC 3.73 06/24/2024 06:20 AM    HGB 12.3 06/24/2024 06:20 AM    HCT 38.2 06/24/2024 06:20 AM    MCV 102.4 06/24/2024 06:20 AM    MCH 33.0 06/24/2024 06:20 AM    MCHC 32.2 06/24/2024 06:20 AM    RDW 13.6 06/24/2024 06:20 AM    PLT 223 06/24/2024 06:20 AM     BMP:    Lab Results   Component Value Date/Time    GLUCOSE 108 06/24/2024 06:20 AM    NA 147 06/24/2024 06:20 AM    K  4.3 06/24/2024 06:20 AM    CL 118 06/24/2024 06:20 AM    CO2 17 06/24/2024 06:20 AM    ANIONGAP 12 06/24/2024 06:20 AM    BUN 32 06/24/2024 06:20 AM    CREATININE 1.2 06/24/2024 06:20 AM    CALCIUM 9.1 06/24/2024 06:20 AM    LABGLOM 45 06/24/2024 06:20 AM    LABGLOM 50 08/11/2022 12:32 PM    GFRAA 96 02/03/2020 11:02 AM       Discharge Exam:  BP (!) 119/57   Pulse 87   Temp 99 F (37.2 C) (Oral)   Resp 16   Ht 1.524 m (5')   Wt 43.1 kg (95 lb)   SpO2 94%   BMI 18.55 kg/m   Gen: NAD  CV: RRR  Lungs: CTAB  Abd: soft, nt, nd, +BS  Ext: no LE edema        Discharge Plan   Disposition: DISPOSITION Hospitalist: Home with PCP follow-up    Provider Follow-Up:   Juanito Charlies Helling, PA-C  [address] [place] [address]  [phone]    Follow up on 07/25/2024  2:40pm.  Please arrive by 2:25pm for check in.    Allena Comer RAMAN, MD  [address]  [place] [address]  [phone]    Schedule an appointment as soon as possible for a visit in 2 week(s)  for hospital discharge follow up    Rubin Donnice Meres, MD  [address] [place] [address]  [phone]    Follow up in 4 week(s)  office should call you for follow up to go over heart monitor data in 4-6 weeks         Patient Instructions   Diet: full liquid and advance to regular over the next couple of days as tolerated  Activity: activity as tolerated      Discharge Medications      Current Discharge Medication List             Details   sucralfate  (CARAFATE ) 1 GM tablet Take 1 tablet by mouth 4 times daily (before meals and nightly)  Qty: 120 tablet, Refills: 0      metoprolol  succinate (TOPROL  XL) 25 MG extended release tablet Take 1 tablet by mouth daily  Qty: 30 tablet, Refills: 3                Details  omeprazole  (PRILOSEC) 40 MG delayed release capsule Take 1 capsule by mouth in the morning and at bedtime  Qty: 60 capsule, Refills: 0                Details   ketoconazole  (NIZORAL ) 2 % cream Apply topically twice a  day  Qty: 60 g, Refills: 1      Multiple Vitamins-Minerals (CENTRUM/CERTA-VITE WITH MINERALS ORAL) solution Take 15 mLs by mouth daily      calcium carb-cholecalciferol (CALTRATE 600+D3) 600-20 MG-MCG TABS               Current Outpatient Medications   Medication Instructions    calcium carb-cholecalciferol (CALTRATE 600+D3) 600-20 MG-MCG TABS     ketoconazole  (NIZORAL ) 2 % cream Apply topically twice a day    [START ON 06/25/2024] metoprolol  succinate (TOPROL  XL) 25 mg, Oral, DAILY    Multiple Vitamins-Minerals (CENTRUM/CERTA-VITE WITH MINERALS ORAL) solution 15 mLs, Oral, DAILY    omeprazole  (PRILOSEC) 40 mg, Oral, 2 times daily    sucralfate  (CARAFATE ) 1 g, Oral, 4 TIMES DAILY BEFORE MEALS & NIGHTLY         Time Spent on Discharge:  40 minutes were spent in patient examination, evaluation, counseling as well as medication reconciliation, prescriptions for required medications, discharge plan and follow up.        Electronically signed by Vernell Benton Ned, MD on 06/24/24 at 9:26 AM EST    "

## 2024-06-24 NOTE — Progress Notes (Signed)
 "    Progress Note  Date:06/24/2024       Room:0218/01  Patient Name:Linda Murray     Date of Birth:1940-12-20     Age:83 y.o.        Subjective    Subjective feels better this morning.  Able to tol po.  Review of Systems  Objective         Vitals Last 24 Hours:  TEMPERATURE:  Temp  Avg: 98.5 F (36.9 C)  Min: 97.4 F (36.3 C)  Max: 99 F (37.2 C)  RESPIRATIONS RANGE: Resp  Avg: 17.9  Min: 2  Max: 30  PULSE OXIMETRY RANGE: SpO2  Avg: 95.3 %  Min: 94 %  Max: 97 %  PULSE RANGE: Pulse  Avg: 107.6  Min: 65  Max: 179  BLOOD PRESSURE RANGE: Systolic (24hrs), Avg:112 , Min:65 , Max:157   ; Diastolic (24hrs), Avg:69, Min:44, Max:85    I/O (24Hr):    Intake/Output Summary (Last 24 hours) at 06/24/2024 0907  Last data filed at 06/24/2024 9379  Gross per 24 hour   Intake 3482 ml   Output 100 ml   Net 3382 ml     Objective:  General Appearance:  Comfortable.    Vital signs: (most recent): Blood pressure 127/68, pulse 68, temperature 99 F (37.2 C), temperature source Oral, resp. rate 16, height 1.524 m (5'), weight 43.1 kg (95 lb), SpO2 94%.    HEENT: Normal HEENT exam.    Lungs:  Normal effort and normal respiratory rate.    Heart: Normal rate.  Regular rhythm.    Abdomen: Abdomen is soft and non-distended.  Bowel sounds are normal.   There is no abdominal tenderness.       Current Facility-Administered Medications: sodium chloride  flush 0.9 % injection 5-40 mL, 5-40 mL, IntraVENous, 2 times per day  sodium chloride  flush 0.9 % injection 5-40 mL, 5-40 mL, IntraVENous, PRN  0.9 % sodium chloride  infusion, , IntraVENous, PRN  ondansetron  (ZOFRAN -ODT) disintegrating tablet 4 mg, 4 mg, Oral, Q8H PRN **OR** ondansetron  (ZOFRAN ) injection 4 mg, 4 mg, IntraVENous, Q6H PRN  polyethylene glycol (GLYCOLAX ) packet 17 g, 17 g, Oral, Daily PRN  aluminum & magnesium hydroxide-simethicone (MAALOX PLUS) 200-200-20 MG/5ML suspension 30 mL, 30 mL, Oral, Q6H PRN  acetaminophen  (TYLENOL ) tablet 650 mg, 650 mg, Oral, Q6H PRN **OR** acetaminophen   (TYLENOL ) suppository 650 mg, 650 mg, Rectal, Q6H PRN  0.9 % sodium chloride  infusion, , IntraVENous, Continuous  pantoprazole  (PROTONIX ) injection 40 mg, 40 mg, IntraVENous, Daily   Labs/Imaging/Diagnostics    Labs:  CBC:  Recent Labs     06/23/24  1155 06/24/24  0620   WBC 14.4* 7.3   RBC 4.42 3.73   HGB 14.5 12.3   HCT 44.8 38.2   MCV 101.4* 102.4*   RDW 13.5 13.6   PLT 288 223     CHEMISTRIES:  Recent Labs     06/23/24  1155 06/23/24  1246 06/24/24  0620   NA 147*  --  147*   K Hemolyzed* 4.5 4.3   CL 111*  --  118*   CO2 19*  --  17*   BUN 45*  --  32*   CREATININE 1.7*  --  1.2*   GLUCOSE 127*  --  108*   MG 2.7*  --   --      PT/INR:No results for input(s): PROTIME, INR in the last 72 hours.  APTT:No results for input(s): APTT in the last 72 hours.  LIVER  PROFILE:  Recent Labs     06/23/24  1155 06/24/24  0620   AST 39 22   ALT 22 15   BILITOT 2.20* 1.26*   ALKPHOS 72 62       Imaging Last 24 Hours:  No results found.  Assessment//Plan           Hospital Problems           Last Modified POA    * (Principal) Atrial fibrillation with RVR (HCC) 06/23/2024 Yes     Assessment & Plan    Ms. Budzik is an 83 year old female with prior history of hemorrhagic stroke, brain aneurysm with right-sided weakness Admitted with food impaction.  EGD revealed food impaction in the midesophagus that was dislodged with the endoscope.  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  There was a large hiatal hernia which in the thoracic stomach and fundic gland polyps.  The duodenum was normal.    1.  Food impaction with mid esophageal ulceration and intrathoracic stomach.  May discharge when tolerating full liquid diet.  Recommend twice daily PPI for 4 weeks then daily.  Recommend Carafate  1 g 4 times daily (before meals and before bedtime) for 1 month and then as needed.    Will arrange outpatient fu.     Electronically signed by Charlies Macario Krebs, PA-C on 06/24/24  at 9:07 AM EST   "

## 2024-06-24 NOTE — Care Coordination (Signed)
"     06/24/24 1445   Service Assessment   Information Provided By Medical Record;Physician   Patient Orientation Alert;Oriented   Cognition No Apparent Deficit   Primary Caregiver Self   Support Systems Children   Patient's Healthcare Decision Maker is: Legal Next of Kin   PCP Verified by CM Yes   Prior Functional Level Unable to Assess   Current Functional Level at Time of Initial Assessment Unable to Assess   Can patient return to prior living arrangement Yes   Ability to make needs known: Good   Family able to assist with home care needs: Yes   Other than yourself, who should be included in your transition plan for discharge from the hospital? son   [facility]   Current Community Resources None   Discharge Planning    Location Prior to Acute Admission Independent Living     12/5/jb- Pt admitted for observation for afib post EGD with disimpaction of food. Pt was cleared and d/c home to ILF. No needs noted.     "

## 2024-06-24 NOTE — Discharge Instructions (Addendum)
"  You should increase omeprazole  to twice daily for 4 weeks, then continue to take once daily. A prescription for carafate  has been sent to your pharmacy. You can dissolve the tablet in 10 cc of water to make a slurry which will coat your esophagus to help the ulcer heal. You should take this for 1 month.    Eat a full liquid diet for the next day or 2. If you do well with this without pain or difficulty swallowing, you can start to eat more solid foods.    Cardiology has recommend you start a low dose of a medication called metoprolol  to keep heart rate under control. They have also recommended you wear a monitor for 30 days to ensure you are not having recurrent atrial fibrillation and will set up a follow up appointment for you in 4-6 weeks to review the data.    If you have questions or concerns for the hospitalist, you can call [phone].    "

## 2024-08-04 ENCOUNTER — Ambulatory Visit
Admit: 2024-08-04 | Discharge: 2024-08-04 | Payer: Medicare (Managed Care) | Attending: Physician Assistant | Primary: Family Medicine

## 2024-08-04 VITALS — BP 140/80 | HR 70 | Wt 95.0 lb

## 2024-08-04 DIAGNOSIS — I480 Paroxysmal atrial fibrillation: Principal | ICD-10-CM

## 2024-08-04 NOTE — Progress Notes (Signed)
 "                          Date:  August 04, 2024  Patient name: Linda Murray  Date of Birth: 17-Jan-1941      REASON FOR CLINIC VISIT: Follow-up  PRIMARY CARDIOLOGIST: Dr. Rubin    HISTORY OF PRESENT ILLNESS        Linda Murray is a 84 y.o. female with history of paroxysmal atrial fibrillation (new diagnosis in 06/2024) in the setting of food impaction and severe dehydration.  She has not had any recurrent atrial fibrillation that they are aware of.  She tried to wear all M cot monitor but she was not able to do so given her mental status.  She has had a history of a brain bleed in the past and has somedeficits from this.  Son is present at visit today says she has been doing really quite well they have not had any recurrent atrial fibrillation that she is aware of.     PAST HISTORY          Past Medical History:   Past Medical History:   Diagnosis Date    Brain aneurysm     (aphasia, hemiparesis right side).    CKD (chronic kidney disease), stage II     Hyperlipidemia     Osteoporosis     Seizure (HCC)     Urinary incontinence         Social History:   Social History     Tobacco Use    Smoking status: Former     Types: Cigarettes     Passive exposure: Never    Smokeless tobacco: Never   Substance Use Topics    Alcohol use: Never    Drug use: Never       Family History: History reviewed. No pertinent family history.     Allergies: Patient has no known allergies.    Medications:   Prior to Admission medications   Medication Sig Start Date End Date Taking? Authorizing Provider   omeprazole  (PRILOSEC) 40 MG delayed release capsule Take 1 capsule by mouth in the morning and at bedtime 06/24/24  Yes Debby Vernell Folks, MD   sucralfate  (CARAFATE ) 1 GM tablet Take 1 tablet by mouth 4 times daily (before meals and nightly) 06/24/24  Yes Debby Vernell Folks, MD   metoprolol  succinate (TOPROL  XL) 25 MG extended release tablet Take 1 tablet by mouth daily 06/25/24  Yes Debby Vernell Folks, MD   Multiple Vitamins-Minerals  (CENTRUM/CERTA-VITE WITH MINERALS ORAL) solution Take 15 mLs by mouth daily   Yes [provider]   calcium carb-cholecalciferol (CALTRATE 600+D3) 600-20 MG-MCG TABS  10/22/17  Yes [provider]   ketoconazole  (NIZORAL ) 2 % cream Apply topically twice a day  Patient not taking: Reported on 08/04/2024 05/04/24   Elgin Redell MATSU, DPM       PHYSICAL EXAM      BP (!) 140/80 (Patient Position: Sitting)   Pulse 70   Wt 43.1 kg (95 lb)   SpO2 95%   BMI 18.55 kg/m     CONSTITUTIONAL:  alert, conversational, no apparent distress, quite frail wheelchair-bound  ENT:   moist mucus membranes  LUNGS:  clear to auscultation bilaterally, non-labored  CARDIOVASCULAR:  regular rate and rhythm, no audible murmurs, JVD, no carotid bruit  ABDOMEN:  soft, non-tender, + bowel sounds  EXTREMITIES:  no LE edema  NEUROLOGIC:  no focal deficits  DIAGNOSTIC STUDIES      EKG:   Encounter Date: 06/23/24   EKG 12 Lead (Abn HR)   Result Value    Ventricular Rate 79    P-R Interval 152    QRS Duration 78    Q-T Interval 422    QTc Calculation (Bazett) 457    P Axis 60    R Axis 58    T Axis 76    Diagnosis      SINUS RHYTHM WITH SINUS ARRHYTHMIA  LOW QRS VOLTAGE IN CHEST LEADS [QRS DEFLECTION < 1.0 MV IN CHEST LEADS]  NONSPECIFIC T WAVE ABNORMALITY  LONG QT INTERVAL  ABNORMAL ECG    When compared to most recent ECG,  rhtyhm is now sinus  Confirmed by Rojean Purchase ([id number]) on 06/23/2024 6:08:36 PM         Echo: 06/23/24    ECHO (TTE) COMPLETE (PRN CONTRAST/BUBBLE/STRAIN/3D) 06/24/2024  3:51 PM (Final)    Interpretation Summary    Left Ventricle: Normal left ventricular systolic function with a visually estimated EF of 55 - 60%. Left ventricle size is normal. Normal wall thickness. Normal wall motion. Diastolic dysfunction present with normal LV EF.    Right Ventricle: Right ventricle size is normal. Normal systolic function.    Aortic Valve: Trileaflet valve. Mild sclerosis of the aortic valve cusps. Mild to moderate  regurgitation.    Mitral Valve: Mild regurgitation.    Tricuspid Valve: Mild regurgitation.    Image quality is adequate. Technically difficult study due to patient's body habitus.    Signed by: Linda Samie Angry, MD on 06/24/2024  3:51 PM      Stress Test: No results found for this or any previous visit.       Last Cardiac Catheterization: No results found for this or any previous visit.       Monitor Result: No results found for this or any previous visit.       Lab Results   Component Value Date    WBC 7.3 06/24/2024    HGB 12.3 06/24/2024    HCT 38.2 06/24/2024    MCV 102.4 (H) 06/24/2024    PLT 223 06/24/2024      Lab Results   Component Value Date/Time    NA 147 06/24/2024 06:20 AM    K 4.3 06/24/2024 06:20 AM    CL 118 06/24/2024 06:20 AM    CO2 17 06/24/2024 06:20 AM    BUN 32 06/24/2024 06:20 AM    CREATININE 1.2 06/24/2024 06:20 AM    GLUCOSE 108 06/24/2024 06:20 AM    CALCIUM 9.1 06/24/2024 06:20 AM    LABGLOM 45 06/24/2024 06:20 AM    LABGLOM 50 08/11/2022 12:32 PM      Lab Results   Component Value Date/Time    ALKPHOS 62 06/24/2024 06:20 AM    ALT 15 06/24/2024 06:20 AM    AST 22 06/24/2024 06:20 AM    BILITOT 1.26 06/24/2024 06:20 AM    BILIDIR <0.20 08/23/2019 04:34 AM      No results found for: CHOL, TRIG, HDL, LDL, VLDL, CHOLHDLRATIO     The ASCVD Risk score (Arnett DK, et al., 2019) failed to calculate for the following reasons:    The 2019 ASCVD risk score is only valid for ages 54 to 63     ASSESSMENT / RECOMMENDATIONS        1. PAF (paroxysmal atrial fibrillation) (HCC)  - Transient A-fib RVR in the setting of esophageal impaction and  severe dehydration.  No known recurrent atrial fibrillation though she has not had any extended monitoring nor are they interested in further monitoring.  We have discussed risk and benefits of anticoagulation due to think she is a very poor candidate for anticoagulation given her history of brain bleeds/hemorrhagic CVA.  I would continue the low-dose  of metoprolol .  Further discussions regarding continuous conservative management with son.   No follow-ups on file.    Darice Cy Gainer, [facility] / [facility]    NOTE: This report was transcribed using voice recognition software. Every effort was made to ensure accuracy, however, inadvertent computerized transcription errors may be present.   "

## 2024-08-04 NOTE — Patient Instructions (Signed)
"  Continue with Metoprolol    Follow up 1-2 years.   "
